# Patient Record
Sex: Male | Born: 1985 | Race: White | Hispanic: No | Marital: Single | Smoking: Never smoker
Health system: Southern US, Community
[De-identification: ages and names within clinical notes are randomized; demographics above are authoritative.]

## PROBLEM LIST (undated history)

## (undated) DIAGNOSIS — H5440 Blindness, one eye, unspecified eye: Secondary | ICD-10-CM

## (undated) DIAGNOSIS — H409 Unspecified glaucoma: Secondary | ICD-10-CM

## (undated) DIAGNOSIS — R519 Headache, unspecified: Secondary | ICD-10-CM

## (undated) DIAGNOSIS — F419 Anxiety disorder, unspecified: Secondary | ICD-10-CM

## (undated) DIAGNOSIS — F32A Depression, unspecified: Secondary | ICD-10-CM

## (undated) DIAGNOSIS — F810 Specific reading disorder: Secondary | ICD-10-CM

## (undated) DIAGNOSIS — F329 Major depressive disorder, single episode, unspecified: Secondary | ICD-10-CM

## (undated) DIAGNOSIS — B019 Varicella without complication: Secondary | ICD-10-CM

## (undated) DIAGNOSIS — F79 Unspecified intellectual disabilities: Secondary | ICD-10-CM

## (undated) DIAGNOSIS — R51 Headache: Secondary | ICD-10-CM

## (undated) HISTORY — PX: OTHER SURGICAL HISTORY: SHX169

## (undated) HISTORY — DX: Unspecified intellectual disabilities: F79

## (undated) HISTORY — DX: Blindness, one eye, unspecified eye: H54.40

## (undated) HISTORY — DX: Specific reading disorder: F81.0

## (undated) HISTORY — DX: Varicella without complication: B01.9

## (undated) HISTORY — DX: Unspecified glaucoma: H40.9

## (undated) HISTORY — DX: Headache: R51

## (undated) HISTORY — DX: Headache, unspecified: R51.9

---

## 2016-04-29 ENCOUNTER — Emergency Department (HOSPITAL_COMMUNITY)
Admission: EM | Admit: 2016-04-29 | Discharge: 2016-04-30 | Disposition: A | Discharge status: Home / Self Care | Payer: Medicare Other | Attending: Emergency Medicine | Admitting: Emergency Medicine

## 2016-04-29 ENCOUNTER — Encounter (HOSPITAL_COMMUNITY): Payer: Self-pay | Admitting: Emergency Medicine

## 2016-04-29 DIAGNOSIS — Z5181 Encounter for therapeutic drug level monitoring: Secondary | ICD-10-CM | POA: Insufficient documentation

## 2016-04-29 DIAGNOSIS — F4321 Adjustment disorder with depressed mood: Secondary | ICD-10-CM | POA: Diagnosis not present

## 2016-04-29 DIAGNOSIS — Z79899 Other long term (current) drug therapy: Secondary | ICD-10-CM | POA: Diagnosis not present

## 2016-04-29 DIAGNOSIS — F1099 Alcohol use, unspecified with unspecified alcohol-induced disorder: Secondary | ICD-10-CM | POA: Diagnosis not present

## 2016-04-29 DIAGNOSIS — R45851 Suicidal ideations: Secondary | ICD-10-CM | POA: Diagnosis present

## 2016-04-29 DIAGNOSIS — Z791 Long term (current) use of non-steroidal anti-inflammatories (NSAID): Secondary | ICD-10-CM | POA: Diagnosis not present

## 2016-04-29 HISTORY — DX: Depression, unspecified: F32.A

## 2016-04-29 HISTORY — DX: Major depressive disorder, single episode, unspecified: F32.9

## 2016-04-29 HISTORY — DX: Anxiety disorder, unspecified: F41.9

## 2016-04-29 NOTE — ED Triage Notes (Signed)
Brought in by [facility] from home with c/o SI.   Pt's mother called [employer] and reported that pt has been verbalizing "I am going to kill myself".  Pt denies SI/HI on triage assessment.  Has remote hx of depression.

## 2016-04-29 NOTE — ED Provider Notes (Signed)
Caleb Hoover Provider Note   CSN: [id number] Arrival date & time: 04/29/16  2350  By signing my name below, I, Caleb Hoover, attest that this documentation has been prepared under the direction and in the presence of Caleb Greek, MD . Electronically Signed: Royce Hoover, Scribe. 04/29/2016. 12:02 AM.  History   Chief Complaint Chief Complaint  Patient presents with  . Suicidal   The history is provided by the patient and medical records. No language interpreter was used.    HPI Comments:  Caleb Hoover is a 30 y.o. male who presents to the Emergency Department escorted by [facility] because his mother was concerned for suicidal ideation.  Pt denies suicidal ideation and depression.  Pt states his mother told him he was no longer going to take care of his bills and states that he told her "he was going to die" without her help.  Pt reports he did not intend for this to convey suicidal ideation.  [facility] adds pt has been in good spirits since he was picked up.    Past Medical History:  Diagnosis Date  . Anxiety   . Depression     Patient Active Problem List   Diagnosis Date Noted  . Adjustment disorder with depressed mood 04/30/2016    History reviewed. No pertinent surgical history.     Home Medications    Prior to Admission medications   Not on File    Family History History reviewed. No pertinent family history.  Social History Social History  Substance Use Topics  . Smoking status: Never Smoker  . Smokeless tobacco: Never Used  . Alcohol use Yes     Allergies   Ceclor [cefaclor]   Review of Systems Review of Systems  Constitutional: Negative for fever.  Psychiatric/Behavioral: Negative for suicidal ideas.  All other systems reviewed and are negative.    Physical Exam Updated Vital Signs BP 138/73 (BP Location: Right Arm)   Pulse (!) 58   Temp 97.9 F (36.6 C) (Oral)   Resp 18   Ht 7' (2.134 m)   Wt (!) 310 lb (140.6  kg)   SpO2 99%   BMI 30.89 kg/m   Physical Exam  Constitutional: He is oriented to person, place, and time. He appears well-developed and well-nourished. No distress.  HENT:  Head: Normocephalic and atraumatic.  Right Ear: Hearing normal.  Left Ear: Hearing normal.  Nose: Nose normal.  Mouth/Throat: Oropharynx is clear and moist and mucous membranes are normal.  Eyes: Conjunctivae and EOM are normal. Pupils are equal, round, and reactive to light.  Neck: Normal range of motion. Neck supple.  Cardiovascular: Regular rhythm, S1 normal and S2 normal.  Exam reveals no gallop and no friction rub.   No murmur heard. Pulmonary/Chest: Effort normal and breath sounds normal. No respiratory distress. He exhibits no tenderness.  Abdominal: Soft. Normal appearance and bowel sounds are normal. There is no hepatosplenomegaly. There is no tenderness. There is no rebound, no guarding, no tenderness at McBurney's point and negative Murphy's sign. No hernia.  Musculoskeletal: Normal range of motion.  Neurological: He is alert and oriented to person, place, and time. He has normal strength. No cranial nerve deficit or sensory deficit. Coordination normal. GCS eye subscore is 4. GCS verbal subscore is 5. GCS motor subscore is 6.  Skin: Skin is warm, dry and intact. No rash noted. No cyanosis.  Psychiatric: He has a normal mood and affect. His speech is normal and behavior is normal. Thought content  normal.  Nursing note and vitals reviewed.    ED Treatments / Results   DIAGNOSTIC STUDIES:  Oxygen Saturation is 98% on RA, NML by my interpretation.    COORDINATION OF CARE:  12:02 AM Discussed treatment plan with pt at bedside and pt agreed to plan.  Labs (all labs ordered are listed, but only abnormal results are displayed) Labs Reviewed  COMPREHENSIVE METABOLIC PANEL  ETHANOL  CBC WITH DIFFERENTIAL/PLATELET  URINE RAPID DRUG SCREEN, HOSP PERFORMED    EKG  EKG  Interpretation  Date/Time:  Sunday April 30 2016 02:02:12 EDT Ventricular Rate:  66 PR Interval:    QRS Duration: 103 QT Interval:  405 QTC Calculation: 425 R Axis:   55 Text Interpretation:  Sinus rhythm Normal ECG Confirmed by Betsey Holiday  MD, Harrell Gave [id number]) on 04/30/2016 2:21:56 AM Also confirmed by Betsey Holiday  MD, CHRISTOPHER [phone]), editor [place] CT, Leda Gauze [phone])  on 04/30/2016 7:54:32 AM       Radiology No results found.  Procedures Procedures (including critical care time)  Medications Ordered in ED Medications - No data to display   Initial Impression / Assessment and Plan / ED Course  I have reviewed the triage vital signs and the nursing notes.  Pertinent labs & imaging results that were available during my care of the patient were reviewed by me and considered in my medical decision making (see chart for details).  Clinical Course    Patient reports that he has been under a lot of stress and has had disagreements with his mother. Mother reportedly called [facility] because the patient told her that he was going to kill himself. At arrival to the emergency department patient denies these clams. He admits to feeling bad at times but does not have an active suicidal plan or ideation. Patient medically cleared and will undergo psychiatric evaluation.  Final Clinical Impressions(s) / ED Diagnoses   Final diagnoses:  Adjustment disorder with depressed mood    New Prescriptions There are no discharge medications for this patient.  I personally performed the services described in this documentation, which was scribed in my presence. The recorded information has been reviewed and is accurate.    Caleb Greek, MD 05/04/16 [phone]

## 2016-04-30 DIAGNOSIS — Z791 Long term (current) use of non-steroidal anti-inflammatories (NSAID): Secondary | ICD-10-CM | POA: Diagnosis not present

## 2016-04-30 DIAGNOSIS — F4321 Adjustment disorder with depressed mood: Secondary | ICD-10-CM | POA: Diagnosis not present

## 2016-04-30 DIAGNOSIS — Z79899 Other long term (current) drug therapy: Secondary | ICD-10-CM

## 2016-04-30 DIAGNOSIS — F1099 Alcohol use, unspecified with unspecified alcohol-induced disorder: Secondary | ICD-10-CM

## 2016-04-30 LAB — CBC WITH DIFFERENTIAL/PLATELET
BASOS ABS: 0 10*3/uL (ref 0.0–0.1)
BASOS PCT: 0 %
EOS ABS: 0 10*3/uL (ref 0.0–0.7)
EOS PCT: 0 %
HCT: 44.6 % (ref 39.0–52.0)
Hemoglobin: 15.1 g/dL (ref 13.0–17.0)
LYMPHS PCT: 25 %
Lymphs Abs: 2.4 10*3/uL (ref 0.7–4.0)
MCH: 29.5 pg (ref 26.0–34.0)
MCHC: 33.9 g/dL (ref 30.0–36.0)
MCV: 87.3 fL (ref 78.0–100.0)
MONO ABS: 0.7 10*3/uL (ref 0.1–1.0)
Monocytes Relative: 7 %
Neutro Abs: 6.4 10*3/uL (ref 1.7–7.7)
Neutrophils Relative %: 68 %
PLATELETS: 276 10*3/uL (ref 150–400)
RBC: 5.11 MIL/uL (ref 4.22–5.81)
RDW: 13.7 % (ref 11.5–15.5)
WBC: 9.5 10*3/uL (ref 4.0–10.5)

## 2016-04-30 LAB — COMPREHENSIVE METABOLIC PANEL
ALT: 34 U/L (ref 17–63)
AST: 34 U/L (ref 15–41)
Albumin: 4.2 g/dL (ref 3.5–5.0)
Alkaline Phosphatase: 84 U/L (ref 38–126)
Anion gap: 6 (ref 5–15)
BILIRUBIN TOTAL: 0.5 mg/dL (ref 0.3–1.2)
BUN: 17 mg/dL (ref 6–20)
CALCIUM: 8.9 mg/dL (ref 8.9–10.3)
CO2: 26 mmol/L (ref 22–32)
CREATININE: 0.97 mg/dL (ref 0.61–1.24)
Chloride: 107 mmol/L (ref 101–111)
GFR calc Af Amer: >60 mL/min (ref >60)
Glucose, Bld: 93 mg/dL (ref 65–99)
Potassium: 3.7 mmol/L (ref 3.5–5.1)
Sodium: 139 mmol/L (ref 135–145)
TOTAL PROTEIN: 7.7 g/dL (ref 6.5–8.1)

## 2016-04-30 LAB — RAPID URINE DRUG SCREEN, HOSP PERFORMED
Amphetamines: NOT DETECTED
BENZODIAZEPINES: NOT DETECTED
Barbiturates: NOT DETECTED
Cocaine: NOT DETECTED
OPIATES: NOT DETECTED
Tetrahydrocannabinol: NOT DETECTED

## 2016-04-30 LAB — ETHANOL

## 2016-04-30 MED ORDER — LORAZEPAM 1 MG PO TABS: 0.0000 mg | ORAL_TABLET | Freq: Two times a day (BID) | ORAL | Status: DC

## 2016-04-30 MED ORDER — THIAMINE HCL 100 MG/ML IJ SOLN: 100.0000 mg | Freq: Every day | INTRAMUSCULAR | Status: DC

## 2016-04-30 MED ORDER — ONDANSETRON HCL 4 MG PO TABS: 4.0000 mg | ORAL_TABLET | Freq: Three times a day (TID) | ORAL | Status: DC | PRN

## 2016-04-30 MED ORDER — IBUPROFEN 200 MG PO TABS
600.0000 mg | ORAL_TABLET | Freq: Three times a day (TID) | ORAL | Status: DC | PRN
  Administered 2016-04-30: 600 mg via ORAL
  Filled 2016-04-30: qty 3

## 2016-04-30 MED ORDER — ALUM & MAG HYDROXIDE-SIMETH 200-200-20 MG/5ML PO SUSP: 30.0000 mL | ORAL | Status: DC | PRN

## 2016-04-30 MED ORDER — ACETAMINOPHEN 325 MG PO TABS: 650.0000 mg | ORAL_TABLET | ORAL | Status: DC | PRN

## 2016-04-30 MED ORDER — VITAMIN B-1 100 MG PO TABS
100.0000 mg | ORAL_TABLET | Freq: Every day | ORAL | Status: DC
  Administered 2016-04-30: 100 mg via ORAL
  Filled 2016-04-30: qty 1

## 2016-04-30 MED ORDER — ZOLPIDEM TARTRATE 5 MG PO TABS: 5.0000 mg | ORAL_TABLET | Freq: Every evening | ORAL | Status: DC | PRN

## 2016-04-30 MED ORDER — LORAZEPAM 1 MG PO TABS: 0.0000 mg | ORAL_TABLET | Freq: Four times a day (QID) | ORAL | Status: DC

## 2016-04-30 NOTE — ED Notes (Signed)
No respiratory or acute distress noted alert and oriented x 3 hospital sitter at bedside.

## 2016-04-30 NOTE — [facility] Suicide Risk Assessment (Signed)
Suicide Risk Assessment  Discharge Assessment   [facility] Discharge Suicide Risk Assessment   Principal Problem: Adjustment disorder with depressed mood Discharge Diagnoses:  Patient Active Problem List   Diagnosis Date Noted  . Adjustment disorder with depressed mood [F43.21] 04/30/2016    Priority: High    Total Time spent with patient: 45 minutes  Musculoskeletal: Strength & Muscle Tone: within normal limits Gait & Station: normal Patient leans: N/A  Psychiatric Specialty Exam: Physical Exam  Constitutional: He is oriented to person, place, and time. He appears well-developed and well-nourished.  HENT:  Head: Normocephalic.  Neck: Normal range of motion.  Respiratory: Effort normal.  Musculoskeletal: Normal range of motion.  Neurological: He is alert and oriented to person, place, and time.  Skin: Skin is warm and dry.  Psychiatric: He has a normal mood and affect. His speech is normal and behavior is normal. Judgment and thought content normal. Cognition and memory are normal.    Review of Systems  Constitutional: Negative.   HENT: Negative.   Eyes: Negative.   Respiratory: Negative.   Cardiovascular: Negative.   Gastrointestinal: Negative.   Genitourinary: Negative.   Musculoskeletal: Negative.   Skin: Negative.   Neurological: Negative.   Endo/Heme/Allergies: Negative.   Psychiatric/Behavioral: Negative.     Blood pressure 141/76, pulse (!) 55, temperature 97.8 F (36.6 C), resp. rate 18, height 7' (2.134 m), weight (!) 140.6 kg (310 lb), SpO2 98 %.Body mass index is 30.89 kg/m.  General Appearance: Casual  Eye Contact:  Good  Speech:  Normal Rate  Volume:  Normal  Mood:  Euthymic  Affect:  Congruent  Thought Process:  Coherent and Descriptions of Associations: Intact  Orientation:  Full (Time, Place, and Person)  Thought Content:  WDL  Suicidal Thoughts:  No  Homicidal Thoughts:  No  Memory:  Immediate;   Good Recent;   Good Remote;   Good  Judgement:   Fair  Insight:  Fair  Psychomotor Activity:  Normal  Concentration:  Concentration: Good and Attention Span: Good  Recall:  Good  Fund of Knowledge:  Fair  Language:  Good  Akathisia:  No  Handed:  Right  AIMS (if indicated):     Assets:  Housing Leisure Time Physical Health Resilience Social Support  ADL's:  Intact  Cognition:  WNL  Sleep:      Mental Status Per Nursing Assessment::   On Admission:   altercation with his mother  Demographic Factors:  Male, Adolescent or young adult and Living alone  Loss Factors: NA  Historical Factors: NA  Risk Reduction Factors:   Sense of responsibility to family, Living with another person, especially a relative and Positive social support  Continued Clinical Symptoms:  None  Cognitive Features That Contribute To Risk:  None    Suicide Risk:  Minimal: No identifiable suicidal ideation.  Patients presenting with no risk factors but with morbid ruminations; may be classified as minimal risk based on the severity of the depressive symptoms    Plan Of Care/Follow-up recommendations:  Activity:  as tolerateed Diet:  heart healthy diet  LORD, JAMISON, NP 04/30/2016, 10:40 AM

## 2016-04-30 NOTE — ED Notes (Signed)
Per TTS consult:  Pt to be further evaluated by psychiatrist in the morning.

## 2016-04-30 NOTE — Consult Note (Signed)
[facility] Consult   Reason for Consult:  Verbal altercation with his mother Referring Physician:  EDP Patient Identification: Caleb Hoover MRN:  [id number] Principal Diagnosis: Adjustment disorder with depressed mood Diagnosis:   Patient Active Problem List   Diagnosis Date Noted  . Adjustment disorder with depressed mood [F43.21] 04/30/2016    Priority: High    Total Time spent with patient: 45 minutes  Subjective:   Caleb Hoover is a 30 y.o. male patient does not warrant admission.  HPI:  30 yo male who presented to the ED after an altercation with his mother.  Evidently, she is upset that he is going to an [place] football in November and he has no money or a job.  He has graduated from high school and went to some college but has worked minimally.  The mother wants him to get a job and pay for the house she is allowing him to live in.  When she told him, he would have to move out if he did not get a job and start paying he told her he would be homeless with nothing to eat and would eventually die.  He adamantly denies he threatened suicide and denies suicidal ideations today.  Caleb Hoover also denies homicidal ideations, hallucinations, or substance abuse.  Admits to one past episode of depression with suicidal ideations but denies feeling this way now.  He plays videos games and works out on a daily basis but has no motivation to work.  Denies depression and wants to have "fun".  Stable for discharge.  Past Psychiatric History: depression  Risk to Self: Suicidal Ideation: No Suicidal Intent: No Is patient at risk for suicide?: No Suicidal Plan?: No Access to Means: No What has been your use of drugs/alcohol within the last 12 months?: pt reports to drinking socially Triggers for Past Attempts: Family contact (pt reports issues with mom) Intentional Self Injurious Behavior: None Risk to Others: Homicidal Ideation: Yes-Currently Present Thoughts of  Harm to Others: Yes-Currently Present Comment - Thoughts of Harm to Others: pt stated "have you ever seen the movie Purge, if that was real I would kill my father" Current Homicidal Intent: No Current Homicidal Plan: No Access to Homicidal Means: No Identified Victim: Father History of harm to others?: No Assessment of Violence: None Noted Does patient have access to weapons?: No Criminal Charges Pending?: No Does patient have a court date: No Prior Inpatient Therapy: Prior Inpatient Therapy: No Prior Outpatient Therapy: Prior Outpatient Therapy: Yes Prior Therapy Dates: 2015 Prior Therapy Facilty/Provider(s): Pt did not know but states someone used to come to his home 1x/week  Reason for Treatment: unsure Does patient have an ACCT team?: No Does patient have Intensive In-House Services?  : No Does patient have [facility] services? : No Does patient have P4[facility] services?: No  Past Medical History:  Past Medical History:  Diagnosis Date  . Anxiety   . Depression    History reviewed. No pertinent surgical history. Family History: History reviewed. No pertinent family history. Family Psychiatric  History: none Social History:  History  Alcohol Use  . Yes     History  Drug Use No    Social History   Social History  . Marital status: Single    Spouse name: N/A  . Number of children: N/A  . Years of education: N/A   Social History Main Topics  . Smoking status: Never Smoker  . Smokeless tobacco: Never Used  . Alcohol use Yes  .  Drug use: No  . Sexual activity: Not Asked   Other Topics Concern  . None   Social History Narrative  . None   Additional Social History:    Allergies:   Allergies  Allergen Reactions  . Ceclor [Cefaclor] Other (See Comments)    Childhood allergy.     Labs:  Results for orders placed or performed during the hospital encounter of 04/29/16 (from the past 48 hour(s))  Comprehensive metabolic panel     Status: None   Collection Time:  04/30/16  2:11 AM  Result Value Ref Range   Sodium 139 135 - 145 mmol/L   Potassium 3.7 3.5 - 5.1 mmol/L   Chloride 107 101 - 111 mmol/L   CO2 26 22 - 32 mmol/L   Glucose, Bld 93 65 - 99 mg/dL   BUN 17 6 - 20 mg/dL   Creatinine, Ser 0.97 0.61 - 1.24 mg/dL   Calcium 8.9 8.9 - 10.3 mg/dL   Total Protein 7.7 6.5 - 8.1 g/dL   Albumin 4.2 3.5 - 5.0 g/dL   AST 34 15 - 41 U/L   ALT 34 17 - 63 U/L   Alkaline Phosphatase 84 38 - 126 U/L   Total Bilirubin 0.5 0.3 - 1.2 mg/dL   GFR calc non Af Amer >60 >60 mL/min   GFR calc Af Amer >60 >60 mL/min    Comment: (NOTE) The eGFR has been calculated using the CKD EPI equation. This calculation has not been validated in all clinical situations. eGFR's persistently <60 mL/min signify possible Chronic Kidney Disease.    Anion gap 6 5 - 15  CBC with Diff     Status: None   Collection Time: 04/30/16  2:11 AM  Result Value Ref Range   WBC 9.5 4.0 - 10.5 K/uL   RBC 5.11 4.22 - 5.81 MIL/uL   Hemoglobin 15.1 13.0 - 17.0 g/dL   HCT 44.6 39.0 - 52.0 %   MCV 87.3 78.0 - 100.0 fL   MCH 29.5 26.0 - 34.0 pg   MCHC 33.9 30.0 - 36.0 g/dL   RDW 13.7 11.5 - 15.5 %   Platelets 276 150 - 400 K/uL   Neutrophils Relative % 68 %   Neutro Abs 6.4 1.7 - 7.7 K/uL   Lymphocytes Relative 25 %   Lymphs Abs 2.4 0.7 - 4.0 K/uL   Monocytes Relative 7 %   Monocytes Absolute 0.7 0.1 - 1.0 K/uL   Eosinophils Relative 0 %   Eosinophils Absolute 0.0 0.0 - 0.7 K/uL   Basophils Relative 0 %   Basophils Absolute 0.0 0.0 - 0.1 K/uL  Ethanol     Status: None   Collection Time: 04/30/16  2:12 AM  Result Value Ref Range   Alcohol, Ethyl (B) <5 <5 mg/dL    Comment:        LOWEST DETECTABLE LIMIT FOR SERUM ALCOHOL IS 5 mg/dL FOR MEDICAL PURPOSES ONLY   Urine rapid drug screen (hosp performed)not at [facility]     Status: None   Collection Time: 04/30/16  5:22 AM  Result Value Ref Range   Opiates NONE DETECTED NONE DETECTED   Cocaine NONE DETECTED NONE DETECTED    Benzodiazepines NONE DETECTED NONE DETECTED   Amphetamines NONE DETECTED NONE DETECTED   Tetrahydrocannabinol NONE DETECTED NONE DETECTED   Barbiturates NONE DETECTED NONE DETECTED    Comment:        DRUG SCREEN FOR MEDICAL PURPOSES ONLY.  IF CONFIRMATION IS NEEDED FOR ANY PURPOSE, NOTIFY  LAB WITHIN 5 DAYS.        LOWEST DETECTABLE LIMITS FOR URINE DRUG SCREEN Drug Class       Cutoff (ng/mL) Amphetamine      1000 Barbiturate      200 Benzodiazepine   696 Tricyclics       295 Opiates          300 Cocaine          300 THC              50     Current Facility-Administered Medications  Medication Dose Route Frequency Provider Last Rate Last Dose  . acetaminophen (TYLENOL) tablet 650 mg  650 mg Oral Q4H PRN Orpah Greek, MD      . alum & mag hydroxide-simeth (MAALOX/MYLANTA) 200-200-20 MG/5ML suspension 30 mL  30 mL Oral PRN Orpah Greek, MD      . ibuprofen (ADVIL,MOTRIN) tablet 600 mg  600 mg Oral Q8H PRN Orpah Greek, MD   600 mg at 04/30/16 0918  . ondansetron (ZOFRAN) tablet 4 mg  4 mg Oral Q8H PRN Orpah Greek, MD      . thiamine (VITAMIN B-1) tablet 100 mg  100 mg Oral Daily Orpah Greek, MD   100 mg at 04/30/16 2841   Or  . thiamine (B-1) injection 100 mg  100 mg Intravenous Daily Orpah Greek, MD      . zolpidem (AMBIEN) tablet 5 mg  5 mg Oral QHS PRN Orpah Greek, MD       No current outpatient prescriptions on file.    Musculoskeletal: Strength & Muscle Tone: within normal limits Gait & Station: normal Patient leans: N/A  Psychiatric Specialty Exam: Physical Exam  Constitutional: He is oriented to person, place, and time. He appears well-developed and well-nourished.  HENT:  Head: Normocephalic.  Neck: Normal range of motion.  Respiratory: Effort normal.  Musculoskeletal: Normal range of motion.  Neurological: He is alert and oriented to person, place, and time.  Skin: Skin is warm and dry.   Psychiatric: He has a normal mood and affect. His speech is normal and behavior is normal. Judgment and thought content normal. Cognition and memory are normal.    Review of Systems  Constitutional: Negative.   HENT: Negative.   Eyes: Negative.   Respiratory: Negative.   Cardiovascular: Negative.   Gastrointestinal: Negative.   Genitourinary: Negative.   Musculoskeletal: Negative.   Skin: Negative.   Neurological: Negative.   Endo/Heme/Allergies: Negative.   Psychiatric/Behavioral: Negative.     Blood pressure 141/76, pulse (!) 55, temperature 97.8 F (36.6 C), resp. rate 18, height 7' (2.134 m), weight (!) 140.6 kg (310 lb), SpO2 98 %.Body mass index is 30.89 kg/m.  General Appearance: Casual  Eye Contact:  Good  Speech:  Normal Rate  Volume:  Normal  Mood:  Euthymic  Affect:  Congruent  Thought Process:  Coherent and Descriptions of Associations: Intact  Orientation:  Full (Time, Place, and Person)  Thought Content:  WDL  Suicidal Thoughts:  No  Homicidal Thoughts:  No  Memory:  Immediate;   Good Recent;   Good Remote;   Good  Judgement:  Fair  Insight:  Fair  Psychomotor Activity:  Normal  Concentration:  Concentration: Good and Attention Span: Good  Recall:  Good  Fund of Knowledge:  Fair  Language:  Good  Akathisia:  No  Handed:  Right  AIMS (if indicated):     Assets:  Housing  Leisure Time Physical Health Resilience Social Support  ADL's:  Intact  Cognition:  WNL  Sleep:        Treatment Plan Summary: Daily contact with patient to assess and evaluate symptoms and progress in treatment, Medication management and Plan adjustment disorder with depressed mood:  -Crisis stabilization -Medication management:  No need for medications needed -Individual counseling -outpatient resources  Disposition: No evidence of imminent risk to self or others at present.    Caleb Boga, NP 04/30/2016 10:29 AM   Patient seen face to face for this psych evaluation  along with physician extender, case discussed with treatment team and formulated treatment plan. Patient is stable for discharge back to home as he does not required medication treatment. Reviewed the information documented and agree with the treatment plan.

## 2016-04-30 NOTE — Progress Notes (Signed)
CSW spoke with patient at bedside about outpatient mental health resources. CSW asked patient if he had any questions, patient replied no. CSW provided patient with outpatient mental health resources.

## 2016-04-30 NOTE — ED Notes (Signed)
Report on pt given to TCU RN.
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
## 2016-04-30 NOTE — ED Notes (Signed)
No respiratory or acute distress noted alert and oriented x 3 hospital sitter with pt.

## 2017-01-02 ENCOUNTER — Encounter: Payer: Self-pay | Admitting: Adult Health

## 2017-01-02 ENCOUNTER — Ambulatory Visit (INDEPENDENT_AMBULATORY_CARE_PROVIDER_SITE_OTHER): Payer: Medicare Other | Admitting: Adult Health

## 2017-01-02 VITALS — BP 138/72 | Temp 98.6°F | Ht >= 80 in | Wt 303.9 lb

## 2017-01-02 DIAGNOSIS — F4321 Adjustment disorder with depressed mood: Secondary | ICD-10-CM | POA: Diagnosis not present

## 2017-01-02 DIAGNOSIS — Z7689 Persons encountering health services in other specified circumstances: Secondary | ICD-10-CM

## 2017-01-02 NOTE — Patient Instructions (Signed)
It was great meeting you today!  Please follow up with me for your physical. If you need anything in the meantime, please let me know.    
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
## 2017-03-06 ENCOUNTER — Encounter: Payer: Self-pay | Admitting: Adult Health

## 2017-03-06 ENCOUNTER — Ambulatory Visit (INDEPENDENT_AMBULATORY_CARE_PROVIDER_SITE_OTHER): Payer: Medicare Other | Admitting: Adult Health

## 2017-03-06 VITALS — BP 136/86 | Temp 98.2°F | Ht >= 80 in | Wt 305.0 lb

## 2017-03-06 DIAGNOSIS — R945 Abnormal results of liver function studies: Secondary | ICD-10-CM

## 2017-03-06 DIAGNOSIS — Z0000 Encounter for general adult medical examination without abnormal findings: Secondary | ICD-10-CM

## 2017-03-06 DIAGNOSIS — F4321 Adjustment disorder with depressed mood: Secondary | ICD-10-CM | POA: Diagnosis not present

## 2017-03-06 DIAGNOSIS — N189 Chronic kidney disease, unspecified: Secondary | ICD-10-CM | POA: Diagnosis not present

## 2017-03-06 DIAGNOSIS — E785 Hyperlipidemia, unspecified: Secondary | ICD-10-CM | POA: Diagnosis not present

## 2017-03-06 LAB — LIPID PANEL
CHOL/HDL RATIO: 5
Cholesterol: 210 mg/dL — ABNORMAL HIGH (ref 0–200)
HDL: 45.9 mg/dL (ref >39.00)
LDL Cholesterol: 145 mg/dL — ABNORMAL HIGH (ref 0–99)
NONHDL: 163.72
Triglycerides: 95 mg/dL (ref 0.0–149.0)
VLDL: 19 mg/dL (ref 0.0–40.0)

## 2017-03-06 LAB — HEPATIC FUNCTION PANEL
ALBUMIN: 4.4 g/dL (ref 3.5–5.2)
ALK PHOS: 91 U/L (ref 39–117)
ALT: 21 U/L (ref 0–53)
AST: 21 U/L (ref 0–37)
Bilirubin, Direct: 0.1 mg/dL (ref 0.0–0.3)
TOTAL PROTEIN: 7.3 g/dL (ref 6.0–8.3)
Total Bilirubin: 0.6 mg/dL (ref 0.2–1.2)

## 2017-03-06 LAB — CBC WITH DIFFERENTIAL/PLATELET
Basophils Absolute: 0 10*3/uL (ref 0.0–0.1)
Basophils Relative: 0.3 % (ref 0.0–3.0)
EOS ABS: 0 10*3/uL (ref 0.0–0.7)
Eosinophils Relative: 0.6 % (ref 0.0–5.0)
HEMATOCRIT: 46.9 % (ref 39.0–52.0)
Hemoglobin: 15.6 g/dL (ref 13.0–17.0)
LYMPHS PCT: 31.5 % (ref 12.0–46.0)
Lymphs Abs: 2.3 10*3/uL (ref 0.7–4.0)
MCHC: 33.2 g/dL (ref 30.0–36.0)
MCV: 88.5 fl (ref 78.0–100.0)
Monocytes Absolute: 0.5 10*3/uL (ref 0.1–1.0)
Monocytes Relative: 7.4 % (ref 3.0–12.0)
NEUTROS ABS: 4.4 10*3/uL (ref 1.4–7.7)
Neutrophils Relative %: 60.2 % (ref 43.0–77.0)
PLATELETS: 278 10*3/uL (ref 150.0–400.0)
RBC: 5.3 Mil/uL (ref 4.22–5.81)
RDW: 13.7 % (ref 11.5–15.5)
WBC: 7.3 10*3/uL (ref 4.0–10.5)

## 2017-03-06 LAB — HEMOGLOBIN A1C: Hgb A1c MFr Bld: 5.8 % (ref 4.6–6.5)

## 2017-03-06 LAB — BASIC METABOLIC PANEL
BUN: 12 mg/dL (ref 6–23)
CHLORIDE: 103 meq/L (ref 96–112)
CO2: 27 meq/L (ref 19–32)
CREATININE: 1.15 mg/dL (ref 0.40–1.50)
Calcium: 9 mg/dL (ref 8.4–10.5)
GFR: 79.28 mL/min (ref >60.00)
Glucose, Bld: 87 mg/dL (ref 70–99)
Potassium: 3.6 mEq/L (ref 3.5–5.1)
Sodium: 141 mEq/L (ref 135–145)

## 2017-03-06 LAB — TSH: TSH: 2.06 u[IU]/mL (ref 0.35–4.50)

## 2017-03-06 NOTE — Patient Instructions (Signed)
It was great seeing you today   I will follow up with you regarding your blood work   Please continue to exercise and eat healthy   Follow up in one year or sooner if needed  Health Maintenance, Male A healthy lifestyle and preventative care can promote health and wellness.  Maintain regular health, dental, and eye exams.  Eat a healthy diet. Foods like vegetables, fruits, whole grains, low-fat dairy products, and lean protein foods contain the nutrients you need and are low in calories. Decrease your intake of foods high in solid fats, added sugars, and salt. Get information about a proper diet from your health care provider, if necessary.  Regular physical exercise is one of the most important things you can do for your health. Most adults should get at least 150 minutes of moderate-intensity exercise (any activity that increases your heart rate and causes you to sweat) each week. In addition, most adults need muscle-strengthening exercises on 2 or more days a week.   Maintain a healthy weight. The body mass index (BMI) is a screening tool to identify possible weight problems. It provides an estimate of body fat based on height and weight. Your health care provider can find your BMI and can help you achieve or maintain a healthy weight. For males 20 years and older:  A BMI below 18.5 is considered underweight.  A BMI of 18.5 to 24.9 is normal.  A BMI of 25 to 29.9 is considered overweight.  A BMI of 30 and above is considered obese.  Maintain normal blood lipids and cholesterol by exercising and minimizing your intake of saturated fat. Eat a balanced diet with plenty of fruits and vegetables. Blood tests for lipids and cholesterol should begin at age 35 and be repeated every 5 years. If your lipid or cholesterol levels are high, you are over age 80, or you are at high risk for heart disease, you may need your cholesterol levels checked more frequently.Ongoing high lipid and cholesterol  levels should be treated with medicines if diet and exercise are not working.  If you smoke, find out from your health care provider how to quit. If you do not use tobacco, do not start.  Lung cancer screening is recommended for adults aged 59-80 years who are at high risk for developing lung cancer because of a history of smoking. A yearly low-dose CT scan of the lungs is recommended for people who have at least a 30-pack-year history of smoking and are current smokers or have quit within the past 15 years. A pack year of smoking is smoking an average of 1 pack of cigarettes a day for 1 year (for example, a 30-pack-year history of smoking could mean smoking 1 pack a day for 30 years or 2 packs a day for 15 years). Yearly screening should continue until the smoker has stopped smoking for at least 15 years. Yearly screening should be stopped for people who develop a health problem that would prevent them from having lung cancer treatment.  If you choose to drink alcohol, do not have more than 2 drinks per day. One drink is considered to be 12 oz (360 mL) of beer, 5 oz (150 mL) of wine, or 1.5 oz (45 mL) of liquor.  Avoid the use of street drugs. Do not share needles with anyone. Ask for help if you need support or instructions about stopping the use of drugs.  High blood pressure causes heart disease and increases the risk of stroke.  High blood pressure is more likely to develop in:  People who have blood pressure in the end of the normal range (100-139/85-89 mm Hg).  People who are overweight or obese.  People who are African American.  If you are 24-75 years of age, have your blood pressure checked every 3-5 years. If you are 31 years of age or older, have your blood pressure checked every year. You should have your blood pressure measured twice--once when you are at a hospital or clinic, and once when you are not at a hospital or clinic. Record the average of the two measurements. To check your  blood pressure when you are not at a hospital or clinic, you can use:  An automated blood pressure machine at a pharmacy.  A home blood pressure monitor.  If you are 80-23 years old, ask your health care provider if you should take aspirin to prevent heart disease.  Diabetes screening involves taking a blood sample to check your fasting blood sugar level. This should be done once every 3 years after age 68 if you are at a normal weight and without risk factors for diabetes. Testing should be considered at a younger age or be carried out more frequently if you are overweight and have at least 1 risk factor for diabetes.  Colorectal cancer can be detected and often prevented. Most routine colorectal cancer screening begins at the age of 1 and continues through age 72. However, your health care provider may recommend screening at an earlier age if you have risk factors for colon cancer. On a yearly basis, your health care provider may provide home test kits to check for hidden blood in the stool. A small camera at the end of a tube may be used to directly examine the colon (sigmoidoscopy or colonoscopy) to detect the earliest forms of colorectal cancer. Talk to your health care provider about this at age 20 when routine screening begins. A direct exam of the colon should be repeated every 5-10 years through age 71, unless early forms of precancerous polyps or small growths are found.  People who are at an increased risk for hepatitis B should be screened for this virus. You are considered at high risk for hepatitis B if:  You were born in a country where hepatitis B occurs often. Talk with your health care provider about which countries are considered high risk.  Your parents were born in a high-risk country and you have not received a shot to protect against hepatitis B (hepatitis B vaccine).  You have HIV or AIDS.  You use needles to inject street drugs.  You live with, or have sex with,  someone who has hepatitis B.  You are a man who has sex with other men (MSM).  You get hemodialysis treatment.  You take certain medicines for conditions like cancer, organ transplantation, and autoimmune conditions.  Hepatitis C blood testing is recommended for all people born from [birth year] through 1964 and any individual with known risk factors for hepatitis C.  Healthy men should no longer receive prostate-specific antigen (PSA) blood tests as part of routine cancer screening. Talk to your health care provider about prostate cancer screening.  Testicular cancer screening is not recommended for adolescents or adult males who have no symptoms. Screening includes self-exam, a health care provider exam, and other screening tests. Consult with your health care provider about any symptoms you have or any concerns you have about testicular cancer.  Practice safe sex. Use condoms  and avoid high-risk sexual practices to reduce the spread of sexually transmitted infections (STIs).  You should be screened for STIs, including gonorrhea and chlamydia if:  You are sexually active and are younger than 24 years.  You are older than 24 years, and your health care provider tells you that you are at risk for this type of infection.  Your sexual activity has changed since you were last screened, and you are at an increased risk for chlamydia or gonorrhea. Ask your health care provider if you are at risk.  If you are at risk of being infected with HIV, it is recommended that you take a prescription medicine daily to prevent HIV infection. This is called pre-exposure prophylaxis (PrEP). You are considered at risk if:  You are a man who has sex with other men (MSM).  You are a heterosexual man who is sexually active with multiple partners.  You take drugs by injection.  You are sexually active with a partner who has HIV.  Talk with your health care provider about whether you are at high risk of being  infected with HIV. If you choose to begin PrEP, you should first be tested for HIV. You should then be tested every 3 months for as long as you are taking PrEP.  Use sunscreen. Apply sunscreen liberally and repeatedly throughout the day. You should seek shade when your shadow is shorter than you. Protect yourself by wearing long sleeves, pants, a wide-brimmed hat, and sunglasses year round whenever you are outdoors.  Tell your health care provider of new moles or changes in moles, especially if there is a change in shape or color. Also, tell your health care provider if a mole is larger than the size of a pencil eraser.  A one-time screening for abdominal aortic aneurysm (AAA) and surgical repair of large AAAs by ultrasound is recommended for men aged 84-75 years who are current or former smokers.  Stay current with your vaccines (immunizations).   This information is not intended to replace advice given to you by your health care provider. Make sure you discuss any questions you have with your health care provider.   Document Released: 01/13/2008 Document Revised: 08/07/2014 Document Reviewed: 12/12/2010 Elsevier Interactive Patient Education [employer].

## 2017-03-06 NOTE — Progress Notes (Signed)
Subjective:    Patient ID: Caleb Hoover, male    DOB: [birth year], 31 y.o.   MRN: [id number]  HPI  Patient presents for yearly follow up exam. He is a pleasant 31 year old male who  has a past medical history of Anxiety; Blindness of left eye; Chicken pox; Depression; Frequent headaches; Glaucoma; and Mental retardation.  All immunizations and health maintenance protocols were reviewed with the patient and needed orders were placed.  Appropriate screening laboratory values were ordered for the patient including screening of hyperlipidemia, renal function and hepatic function.  Medication reconciliation,  past medical history, social history, problem list and allergies were reviewed in detail with the patient  Goals were established with regard to weight loss, exercise, and  diet in compliance with medications. He "tries" to eat healthy and exercises 3 times per week.   Wt Readings from Last 3 Encounters:  03/06/17 (!) 305 lb (138.3 kg)  01/02/17 (!) 303 lb 14.4 oz (137.8 kg)  04/29/16 (!) 310 lb (140.6 kg)    He participates in routine dental and vision care.    Review of Systems  Constitutional: Negative.   HENT: Negative.   Eyes: Negative.   Respiratory: Negative.   Cardiovascular: Negative.   Gastrointestinal: Negative.   Endocrine: Negative.   Genitourinary: Negative.   Musculoskeletal: Negative.   Skin: Negative.   Allergic/Immunologic: Negative.   Neurological: Negative.   Hematological: Negative.   Psychiatric/Behavioral: Negative.   All other systems reviewed and are negative.  Past Medical History:  Diagnosis Date  . Anxiety   . Blindness of left eye    hit with paintball at age 43  . Chicken pox   . Depression   . Frequent headaches   . Glaucoma   . Mental retardation    Has trouble reading     Social History   Social History  . Marital status: Single    Spouse name: N/A  . Number of children: N/A  . Years of education: N/A    Occupational History  . Not on file.   Social History Main Topics  . Smoking status: Never Smoker  . Smokeless tobacco: Never Used  . Alcohol use Yes  . Drug use: No  . Sexual activity: Not on file   Other Topics Concern  . Not on file   Social History Narrative  . No narrative on file    Past Surgical History:  Procedure Laterality Date  . left eye surgery       Family History  Problem Relation Age of Onset  . Family history unknown: Yes    Allergies  Allergen Reactions  . Ceclor [Cefaclor] Other (See Comments)    Childhood allergy.     No current outpatient prescriptions on file prior to visit.   No current facility-administered medications on file prior to visit.     BP 136/86 (BP Location: Left Arm)   Temp 98.2 F (36.8 C) (Oral)   Ht 6' 9.25" (2.064 m)   Wt (!) 305 lb (138.3 kg)   BMI 32.48 kg/m       Objective:   Physical Exam  Constitutional: He is oriented to person, place, and time. He appears well-developed and well-nourished. No distress.  HENT:  Head: Normocephalic and atraumatic.  Right Ear: External ear normal.  Left Ear: External ear normal.  Nose: Nose normal.  Mouth/Throat: Oropharynx is clear and moist. No oropharyngeal exudate.  Eyes: Conjunctivae are normal. Right eye exhibits no discharge. Left eye  exhibits no discharge. Left eye exhibits abnormal extraocular motion. Left pupil is not reactive. Pupils are unequal.  Neck: Normal range of motion. Neck supple. No JVD present. No tracheal deviation present. No thyromegaly present.  Cardiovascular: Normal rate, regular rhythm, normal heart sounds and intact distal pulses.  Exam reveals no gallop and no friction rub.   No murmur heard. Pulmonary/Chest: Effort normal and breath sounds normal. No stridor. No respiratory distress. He has no wheezes. He has no rales. He exhibits no tenderness.  Abdominal: Soft. Bowel sounds are normal. He exhibits no distension and no mass. There is no  tenderness. There is no rebound and no guarding.  Musculoskeletal: Normal range of motion.  Lymphadenopathy:    He has no cervical adenopathy.  Neurological: He is alert and oriented to person, place, and time. He displays normal reflexes. No cranial nerve deficit. He exhibits normal muscle tone. Coordination normal.  Skin: Skin is warm and dry. No rash noted. He is not diaphoretic. No erythema. No pallor.  Psychiatric: He has a normal mood and affect. His behavior is normal. Judgment and thought content normal.  Nursing note and vitals reviewed.     Assessment & Plan:  1. Routine general medical examination at a health care facility - Continue to work out and eat healthy.  - Follow up if one year or sooner if needed - Basic metabolic panel - CBC with Differential/Platelet - Hemoglobin A1c - Hepatic function panel - Lipid panel - TSH  2. Adjustment disorder with depressed mood - No need for medication at this time  Dorothyann Peng, NP

## 2018-06-21 ENCOUNTER — Ambulatory Visit (INDEPENDENT_AMBULATORY_CARE_PROVIDER_SITE_OTHER): Payer: Medicare Other | Admitting: Family Medicine

## 2018-06-21 ENCOUNTER — Encounter: Payer: Self-pay | Admitting: Family Medicine

## 2018-06-21 VITALS — BP 150/84 | HR 95 | Temp 97.9°F | Wt 319.5 lb

## 2018-06-21 DIAGNOSIS — D225 Melanocytic nevi of trunk: Secondary | ICD-10-CM

## 2018-06-21 DIAGNOSIS — L720 Epidermal cyst: Secondary | ICD-10-CM

## 2018-06-21 DIAGNOSIS — G4700 Insomnia, unspecified: Secondary | ICD-10-CM | POA: Diagnosis not present

## 2018-06-21 DIAGNOSIS — D229 Melanocytic nevi, unspecified: Secondary | ICD-10-CM

## 2018-06-21 NOTE — Progress Notes (Signed)
Caleb Hoover Resides DOB: [birth year] Encounter date: 06/21/2018  This is a 32 y.o. male who presents with Chief Complaint  Patient presents with  . Nevus    lower back right side, painful to touch,   . Insomnia    having a hard time falling asleep, using meletonin, dose unknown, states that once he falls asleep it is a deep sleep    History of present illness:  Spot on back - felt fine one day and then next day felt like something bit him. Mom worried about cancer. Noted the first time years ago - has had mole there forever. But this came on this week. Not sure what he did.    Can't fall asleep. Works out. No napping. Just difficult to fall asleep. Stays up until 11-12pm. Feels extremely tired. Has been an issue for years (since high school). Not using melatonin now. But has fallen asleep quickly when he used this in past.       Allergies  Allergen Reactions  . Ceclor [Cefaclor] Other (See Comments)    Childhood allergy.    No outpatient medications have been marked as taking for the 06/21/18 encounter (Office Visit) with Caren Macadam, MD.    Review of Systems  Constitutional: Negative for chills, fatigue and fever.  Respiratory: Negative for cough, chest tightness, shortness of breath and wheezing.   Cardiovascular: Negative for chest pain, palpitations and leg swelling.  Skin: Positive for color change (see hpi).    Objective:  BP (!) 150/84 (BP Location: Left Arm, Patient Position: Sitting, Cuff Size: Large)   Pulse 95   Temp 97.9 F (36.6 C) (Oral)   Wt (!) 319 lb 8 oz (144.9 kg)   SpO2 98%   BMI 34.03 kg/m   Weight: (!) 319 lb 8 oz (144.9 kg)   BP Readings from Last 3 Encounters:  06/21/18 (!) 150/84  03/06/17 136/86  01/02/17 138/72   Wt Readings from Last 3 Encounters:  06/21/18 (!) 319 lb 8 oz (144.9 kg)  03/06/17 (!) 305 lb (138.3 kg)  01/02/17 (!) 303 lb 14.4 oz (137.8 kg)    Physical Exam  Constitutional: He is oriented to person,  place, and time. He appears well-developed and well-nourished. No distress.  Cardiovascular: Normal rate, regular rhythm and normal heart sounds. Exam reveals no friction rub.  No murmur heard. No lower extremity edema  Pulmonary/Chest: Effort normal and breath sounds normal. No respiratory distress. He has no wheezes. He has no rales.  Neurological: He is alert and oriented to person, place, and time.  Skin:  0.8x1cm light brown macule with overlying fleshy light brown papule left posterior flank.  0.7cm brown papule, traumatized left posterior flank.   Psychiatric: His behavior is normal. Cognition and memory are normal.   Shave Biopsy Procedure Note  Pre-operative Diagnosis: aggravated mole  Post-operative Diagnosis: same  Locations: left posterior trunk  0.7 cm X 1 cm  Anesthesia: Lidocaine 1% with epinephrine   Procedure Details  Patient informed of the risks, including bleeding and infection, and benefits of the  procedure and Verbal informed consent obtained. The lesion and surrounding area was given a sterile prep using chloraprep and draped in the usual sterile fashion. The skin lesion was shaved superficially using a dermablade.  Hemostasis of biopsy site was achieved using aluminum chloride. Sterile dressing applied. The specimen was sent for pathologic examination. The patient tolerated the procedure well.  Complications: none.  Plan: 1. Instructed to keep the wound dry and covered  for 24 hrs and clean thereafter with soap and water.  But no chlorine hot tubs or pool exposure to surgical site.  2. Warning signs of infection were reviewed.   3. Recommended that the patient use OTC analgesics as needed for pain.  4. Will contact patient with pathology results when obtained.  Micheline Rough, MD   Assessment/Plan  1. Change in mole Suspect benign traumatized mole.  Due to change in bleeding, has been sent for pathology.  I will contact patient once results have  returned.  2. Insomnia: Her that melatonin was a safe sleep aid for inducing sleep.  Certainly let us know if this is not working.  Return if symptoms worsen or fail to improve.        Micheline Rough, MD

## 2019-01-27 ENCOUNTER — Encounter (HOSPITAL_COMMUNITY): Payer: Self-pay | Admitting: Emergency Medicine

## 2019-01-27 ENCOUNTER — Ambulatory Visit (HOSPITAL_COMMUNITY)
Admission: EM | Admit: 2019-01-27 | Discharge: 2019-01-27 | Disposition: A | Discharge status: Home / Self Care | Payer: Medicare Other | Attending: Family Medicine | Admitting: Family Medicine

## 2019-01-27 ENCOUNTER — Other Ambulatory Visit: Payer: Self-pay

## 2019-01-27 DIAGNOSIS — H6123 Impacted cerumen, bilateral: Secondary | ICD-10-CM | POA: Diagnosis not present

## 2019-01-27 DIAGNOSIS — H9201 Otalgia, right ear: Secondary | ICD-10-CM | POA: Diagnosis not present

## 2019-01-27 MED ORDER — CARBAMIDE PEROXIDE 6.5 % OT SOLN
OTIC | Status: AC
  Filled 2019-01-27: qty 15

## 2019-01-27 NOTE — ED Triage Notes (Signed)
Pt here for right ear fullness; pt scheduled to have sx on eye this week per mother

## 2019-01-27 NOTE — Discharge Instructions (Addendum)
Your ear wax was removed today, except for a small amount in the left ear which did not come out with irrigation today.  Follow up as needed with your primary care provider.

## 2019-01-27 NOTE — ED Provider Notes (Signed)
[place] [facility]    CSN: [id number] Arrival date & time: 01/27/19  1436     History   Chief Complaint Chief Complaint  Patient presents with  . Otalgia    HPI Caleb Hoover is a 33 y.o. male accompanied by his mother.   Patient presents today with a 2-day history of right ear fullness and excessive ear wax.  He denies trauma.  He has had similar symptoms in the past which were ear wax removed with irrigation.  He denies fever, chills, sinus congestion, nausea, vomiting, shortness of breath, pain.  The history is provided by the patient and a parent. No language interpreter was used.    Past Medical History:  Diagnosis Date  . Anxiety   . Blindness of left eye    hit with paintball at age 24  . Chicken pox   . Depression   . Frequent headaches   . Glaucoma   . Mental retardation    Has trouble reading     Patient Active Problem List   Diagnosis Date Noted  . Adjustment disorder with depressed mood 04/30/2016    Past Surgical History:  Procedure Laterality Date  . left eye surgery          Home Medications    Prior to Admission medications   Not on File    Family History Family History  Family history unknown: Yes    Social History Social History   Tobacco Use  . Smoking status: Never Smoker  . Smokeless tobacco: Never Used  Substance Use Topics  . Alcohol use: Yes  . Drug use: No     Allergies   Ceclor [cefaclor]   Review of Systems Review of Systems  Constitutional: Negative for chills and fever.  HENT: Positive for ear pain. Negative for sore throat.   Eyes: Negative for pain and visual disturbance.  Respiratory: Negative for cough and shortness of breath.   Cardiovascular: Negative for chest pain and palpitations.  Gastrointestinal: Negative for abdominal pain and vomiting.  Genitourinary: Negative for dysuria and hematuria.  Musculoskeletal: Negative for arthralgias and back pain.  Skin: Negative for color change  and rash.  Neurological: Negative for seizures and syncope.  All other systems reviewed and are negative.    Physical Exam Triage Vital Signs ED Triage Vitals  Enc Vitals Group     BP 01/27/19 1516 137/85     Pulse Rate 01/27/19 1516 90     Resp 01/27/19 1516 18     Temp 01/27/19 1516 98.2 F (36.8 C)     Temp Source 01/27/19 1516 Oral     SpO2 01/27/19 1516 98 %     Weight --      Height --      Head Circumference --      Peak Flow --      Pain Score 01/27/19 1517 2     Pain Loc --      Pain Edu? --      Excl. in [place]? --    No data found.  Updated Vital Signs BP 137/85 (BP Location: Right Arm)   Pulse 90   Temp 98.2 F (36.8 C) (Oral)   Resp 18   SpO2 98%   Visual Acuity Right Eye Distance:   Left Eye Distance:   Bilateral Distance:    Right Eye Near:   Left Eye Near:    Bilateral Near:     Physical Exam Vitals signs and nursing note reviewed.  Constitutional:      Appearance: He is well-developed.  HENT:     Head: Normocephalic and atraumatic.     Right Ear: There is impacted cerumen.     Left Ear: There is impacted cerumen.  Eyes:     Conjunctiva/sclera: Conjunctivae normal.  Neck:     Musculoskeletal: Neck supple.  Cardiovascular:     Rate and Rhythm: Normal rate and regular rhythm.  Pulmonary:     Effort: Pulmonary effort is normal. No respiratory distress.     Breath sounds: Normal breath sounds.  Abdominal:     Palpations: Abdomen is soft.     Tenderness: There is no abdominal tenderness.  Skin:    General: Skin is warm and dry.  Neurological:     Mental Status: He is alert.      UC Treatments / Results  Labs (all labs ordered are listed, but only abnormal results are displayed) Labs Reviewed - No data to display  EKG None  Radiology No results found.  Procedures Ear Cerumen Removal  Date/Time: 01/27/2019 3:28 PM Performed by: Sharion Balloon, NP Authorized by: Raylene Everts, MD   Consent:    Consent obtained:  Verbal    Consent given by:  Parent and patient Procedure details:    Location:  L ear and R ear   Procedure type: irrigation   Post-procedure details:    Inspection:  TM intact   Hearing quality:  Normal   Patient tolerance of procedure:  Tolerated well, no immediate complications   (including critical care time)  Medications Ordered in UC Medications  carbamide peroxide (DEBROX) 6.5 % OTIC (EAR) solution (has no administration in time range)    Initial Impression / Assessment and Plan / UC Course  I have reviewed the triage vital signs and the nursing notes.  Pertinent labs & imaging results that were available during my care of the patient were reviewed by me and considered in my medical decision making (see chart for details).   Cerumen impaction, resolved with irrigation.  Follow up with primary care provider as needed.    Final Clinical Impressions(s) / UC Diagnoses   Final diagnoses:  Bilateral impacted cerumen     Discharge Instructions     Your ear wax was removed today, except for a small amount in the left ear which did not come out with irrigation today.  Follow up as needed with your primary care provider.      ED Prescriptions    None     Controlled Substance Prescriptions [place] Controlled Substance Registry consulted? Not Applicable   Sharion Balloon, NP 01/27/19 1624

## 2019-04-03 ENCOUNTER — Telehealth (INDEPENDENT_AMBULATORY_CARE_PROVIDER_SITE_OTHER): Payer: Medicare Other | Admitting: Adult Health

## 2019-04-03 ENCOUNTER — Other Ambulatory Visit: Payer: Self-pay

## 2019-04-03 DIAGNOSIS — J22 Unspecified acute lower respiratory infection: Secondary | ICD-10-CM | POA: Diagnosis not present

## 2019-04-03 MED ORDER — PREDNISONE 10 MG PO TABS: ORAL_TABLET | ORAL | 0 refills | Status: DC

## 2019-04-03 MED ORDER — BENZONATATE 200 MG PO CAPS: 200.0000 mg | ORAL_CAPSULE | Freq: Two times a day (BID) | ORAL | 0 refills | Status: DC | PRN

## 2019-04-03 NOTE — Progress Notes (Signed)
Virtual Visit via Video Note  I connected with Caleb Hoover on 04/03/19 at 10:00 AM EDT by a video enabled telemedicine application and verified that I am speaking with the correct person using two identifiers.  Location patient: home Location provider:work or home office Persons participating in the virtual visit: patient, provider  I discussed the limitations of evaluation and management by telemedicine and the availability of in person appointments. The patient expressed understanding and agreed to proceed.   HPI: 33 year old male who is being evaluated today for an acute issue.  He reports that he has had a dry constant cough for the last 2 to 3 weeks.  Associated symptoms include chest tightness with deep breath, not feeling as though he is getting a full breath, and some mild wheezing( especially with exhaling) and shortness of breath.  He denies any fevers, chills, or feeling acutely ill.  His cough is not worse at night.   He has had bronchitis in the past and states "it feels exactly like it did the last time".  Has not been using anything over-the-counter   ROS: See pertinent positives and negatives per HPI.  Past Medical History:  Diagnosis Date  . Anxiety   . Blindness of left eye    hit with paintball at age 29  . Chicken pox   . Depression   . Frequent headaches   . Glaucoma   . Mental retardation    Has trouble reading     Past Surgical History:  Procedure Laterality Date  . left eye surgery       Family History  Family history unknown: Yes    No current outpatient medications on file.  EXAM:  VITALS per patient if applicable:  GENERAL: alert, oriented, appears well and in no acute distress  HEENT: atraumatic, conjunttiva clear, no obvious abnormalities on inspection of external nose and ears  NECK: normal movements of the head and neck  LUNGS: on inspection no signs of respiratory distress, breathing rate appears normal, no obvious gross SOB,  gasping or wheezing  CV: no obvious cyanosis  MS: moves all visible extremities without noticeable abnormality  PSYCH/NEURO: pleasant and cooperative, no obvious depression or anxiety, speech and thought processing grossly intact  ASSESSMENT AND PLAN:  Discussed the following assessment and plan:  1. Lower respiratory infection -Does not appear to be pneumonia possible bronchitis.  Will treat as it is bronchitis with prednisone and Tessalon Perles.  He was advised to follow-up if no improvement in the next 2 to 3 days or sooner if fever develops - predniSONE (DELTASONE) 10 MG tablet; 40 mg x 3 days, 20 mg x 3 days, 10 mg x 3 days  Dispense: 21 tablet; Refill: 0 - benzonatate (TESSALON) 200 MG capsule; Take 1 capsule (200 mg total) by mouth 2 (two) times daily as needed for cough.  Dispense: 20 capsule; Refill: 0     I discussed the assessment and treatment plan with the patient. The patient was provided an opportunity to ask questions and all were answered. The patient agreed with the plan and demonstrated an understanding of the instructions.   The patient was advised to call back or seek an in-person evaluation if the symptoms worsen or if the condition fails to improve as anticipated.   Dorothyann Peng, NP

## 2019-09-08 ENCOUNTER — Ambulatory Visit: Payer: Medicare Other | Attending: Internal Medicine

## 2019-09-08 ENCOUNTER — Other Ambulatory Visit: Payer: Self-pay

## 2019-09-08 DIAGNOSIS — Z20822 Contact with and (suspected) exposure to covid-19: Secondary | ICD-10-CM

## 2019-09-09 ENCOUNTER — Telehealth: Payer: Self-pay | Admitting: General Practice

## 2019-09-09 LAB — NOVEL CORONAVIRUS, NAA: SARS-CoV-2, NAA: NOT DETECTED

## 2019-09-09 NOTE — Telephone Encounter (Signed)
Gave patient mother negative covid test results.

## 2019-10-04 DIAGNOSIS — I454 Nonspecific intraventricular block: Secondary | ICD-10-CM | POA: Diagnosis not present

## 2019-10-04 DIAGNOSIS — H5462 Unqualified visual loss, left eye, normal vision right eye: Secondary | ICD-10-CM | POA: Diagnosis not present

## 2019-10-04 DIAGNOSIS — I493 Ventricular premature depolarization: Secondary | ICD-10-CM | POA: Diagnosis not present

## 2019-10-04 DIAGNOSIS — Z20822 Contact with and (suspected) exposure to covid-19: Secondary | ICD-10-CM | POA: Diagnosis not present

## 2019-10-04 DIAGNOSIS — D72829 Elevated white blood cell count, unspecified: Secondary | ICD-10-CM | POA: Diagnosis not present

## 2019-10-04 DIAGNOSIS — Z03818 Encounter for observation for suspected exposure to other biological agents ruled out: Secondary | ICD-10-CM | POA: Diagnosis not present

## 2019-10-05 DIAGNOSIS — H5440 Blindness, one eye, unspecified eye: Secondary | ICD-10-CM | POA: Diagnosis not present

## 2019-10-06 DIAGNOSIS — Z79899 Other long term (current) drug therapy: Secondary | ICD-10-CM | POA: Diagnosis not present

## 2019-10-06 DIAGNOSIS — I493 Ventricular premature depolarization: Secondary | ICD-10-CM | POA: Diagnosis not present

## 2019-10-06 DIAGNOSIS — I4589 Other specified conduction disorders: Secondary | ICD-10-CM | POA: Diagnosis not present

## 2019-10-06 DIAGNOSIS — Z5181 Encounter for therapeutic drug level monitoring: Secondary | ICD-10-CM | POA: Diagnosis not present

## 2019-11-10 DIAGNOSIS — H5440 Blindness, one eye, unspecified eye: Secondary | ICD-10-CM | POA: Diagnosis not present

## 2019-11-10 DIAGNOSIS — H5710 Ocular pain, unspecified eye: Secondary | ICD-10-CM | POA: Diagnosis not present

## 2020-02-04 DIAGNOSIS — Z79899 Other long term (current) drug therapy: Secondary | ICD-10-CM | POA: Diagnosis not present

## 2020-02-04 DIAGNOSIS — H3589 Other specified retinal disorders: Secondary | ICD-10-CM | POA: Diagnosis not present

## 2020-02-04 DIAGNOSIS — H5789 Other specified disorders of eye and adnexa: Secondary | ICD-10-CM | POA: Diagnosis not present

## 2020-02-04 DIAGNOSIS — H2012 Chronic iridocyclitis, left eye: Secondary | ICD-10-CM | POA: Diagnosis not present

## 2020-02-04 DIAGNOSIS — H10402 Unspecified chronic conjunctivitis, left eye: Secondary | ICD-10-CM | POA: Diagnosis not present

## 2020-02-04 DIAGNOSIS — H5710 Ocular pain, unspecified eye: Secondary | ICD-10-CM | POA: Diagnosis not present

## 2020-02-04 DIAGNOSIS — H5440 Blindness, one eye, unspecified eye: Secondary | ICD-10-CM | POA: Diagnosis not present

## 2020-02-04 DIAGNOSIS — H5442A5 Blindness left eye category 5, normal vision right eye: Secondary | ICD-10-CM | POA: Diagnosis not present

## 2020-02-05 ENCOUNTER — Encounter (HOSPITAL_BASED_OUTPATIENT_CLINIC_OR_DEPARTMENT_OTHER): Payer: Self-pay | Admitting: *Deleted

## 2020-02-05 ENCOUNTER — Emergency Department (HOSPITAL_BASED_OUTPATIENT_CLINIC_OR_DEPARTMENT_OTHER): Payer: Medicare Other

## 2020-02-05 ENCOUNTER — Other Ambulatory Visit: Payer: Self-pay

## 2020-02-05 ENCOUNTER — Emergency Department (HOSPITAL_BASED_OUTPATIENT_CLINIC_OR_DEPARTMENT_OTHER)
Admission: EM | Admit: 2020-02-05 | Discharge: 2020-02-05 | Disposition: A | Discharge status: Home / Self Care | Payer: Medicare Other | Attending: Emergency Medicine | Admitting: Emergency Medicine

## 2020-02-05 DIAGNOSIS — R112 Nausea with vomiting, unspecified: Secondary | ICD-10-CM | POA: Insufficient documentation

## 2020-02-05 DIAGNOSIS — R519 Headache, unspecified: Secondary | ICD-10-CM | POA: Insufficient documentation

## 2020-02-05 DIAGNOSIS — R079 Chest pain, unspecified: Secondary | ICD-10-CM | POA: Insufficient documentation

## 2020-02-05 DIAGNOSIS — Y848 Other medical procedures as the cause of abnormal reaction of the patient, or of later complication, without mention of misadventure at the time of the procedure: Secondary | ICD-10-CM | POA: Insufficient documentation

## 2020-02-05 DIAGNOSIS — R0602 Shortness of breath: Secondary | ICD-10-CM | POA: Diagnosis not present

## 2020-02-05 DIAGNOSIS — Z20822 Contact with and (suspected) exposure to covid-19: Secondary | ICD-10-CM | POA: Diagnosis not present

## 2020-02-05 DIAGNOSIS — R05 Cough: Secondary | ICD-10-CM | POA: Diagnosis not present

## 2020-02-05 DIAGNOSIS — T8189XA Other complications of procedures, not elsewhere classified, initial encounter: Secondary | ICD-10-CM | POA: Insufficient documentation

## 2020-02-05 DIAGNOSIS — R10816 Epigastric abdominal tenderness: Secondary | ICD-10-CM | POA: Diagnosis not present

## 2020-02-05 DIAGNOSIS — Z79899 Other long term (current) drug therapy: Secondary | ICD-10-CM | POA: Insufficient documentation

## 2020-02-05 DIAGNOSIS — R1110 Vomiting, unspecified: Secondary | ICD-10-CM | POA: Diagnosis not present

## 2020-02-05 LAB — CBC WITH DIFFERENTIAL/PLATELET
Abs Immature Granulocytes: 0.09 10*3/uL — ABNORMAL HIGH (ref 0.00–0.07)
Basophils Absolute: 0 10*3/uL (ref 0.0–0.1)
Basophils Relative: 0 %
Eosinophils Absolute: 0 10*3/uL (ref 0.0–0.5)
Eosinophils Relative: 0 %
HCT: 44.5 % (ref 39.0–52.0)
Hemoglobin: 14.7 g/dL (ref 13.0–17.0)
Immature Granulocytes: 1 %
Lymphocytes Relative: 8 %
Lymphs Abs: 1.3 10*3/uL (ref 0.7–4.0)
MCH: 28.8 pg (ref 26.0–34.0)
MCHC: 33 g/dL (ref 30.0–36.0)
MCV: 87.1 fL (ref 80.0–100.0)
Monocytes Absolute: 0.8 10*3/uL (ref 0.1–1.0)
Monocytes Relative: 5 %
Neutro Abs: 14.6 10*3/uL — ABNORMAL HIGH (ref 1.7–7.7)
Neutrophils Relative %: 86 %
Platelets: 321 10*3/uL (ref 150–400)
RBC: 5.11 MIL/uL (ref 4.22–5.81)
RDW: 13.4 % (ref 11.5–15.5)
WBC: 16.8 10*3/uL — ABNORMAL HIGH (ref 4.0–10.5)
nRBC: 0 % (ref 0.0–0.2)

## 2020-02-05 LAB — BASIC METABOLIC PANEL
Anion gap: 12 (ref 5–15)
BUN: 11 mg/dL (ref 6–20)
CO2: 24 mmol/L (ref 22–32)
Calcium: 8.7 mg/dL — ABNORMAL LOW (ref 8.9–10.3)
Chloride: 103 mmol/L (ref 98–111)
Creatinine, Ser: 0.83 mg/dL (ref 0.61–1.24)
GFR calc Af Amer: >60 mL/min (ref >60)
GFR calc non Af Amer: >60 mL/min (ref >60)
Glucose, Bld: 121 mg/dL — ABNORMAL HIGH (ref 70–99)
Potassium: 4.1 mmol/L (ref 3.5–5.1)
Sodium: 139 mmol/L (ref 135–145)

## 2020-02-05 LAB — TROPONIN I (HIGH SENSITIVITY): Troponin I (High Sensitivity): 4 ng/L (ref <18)

## 2020-02-05 LAB — D-DIMER, QUANTITATIVE: D-Dimer, Quant: <0.27 ug/mL-FEU (ref 0.00–0.50)

## 2020-02-05 MED ORDER — ACETAMINOPHEN 325 MG PO TABS
650.0000 mg | ORAL_TABLET | Freq: Once | ORAL | Status: AC
  Administered 2020-02-05: 650 mg via ORAL
  Filled 2020-02-05: qty 2

## 2020-02-05 NOTE — ED Notes (Signed)
ED Provider at bedside. 

## 2020-02-05 NOTE — ED Triage Notes (Signed)
He had eye surgery at [facility] yesterday with vomiting after surgery. Today he has had SOB since this am.

## 2020-02-05 NOTE — ED Notes (Signed)
Patient transported to X-ray 

## 2020-02-05 NOTE — ED Provider Notes (Signed)
[facility] EMERGENCY DEPARTMENT Provider Note   CSN: [id number] Arrival date & time: 02/05/20  1309     History Chief Complaint  Patient presents with  . Shortness of Breath    Caleb Hoover is a 34 y.o. 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 Patient is not on anticoagulation.  HPI Patient presents to emergency department today with chief complaint of sudden onset of shortness of breath. 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 Vickki Muff at [facility] yesterday. The procedure went well and he was discharged home with prescription for PO prednisone. He did receive dose of IV steroids in PACU.  This morning he woke up at 5am to first dose of prednisone taper. He was feeling like his usual self at that time. Approximately 3 hours later patient had shortness of breath and chest pain. Patient describes the chest pain as a soreness in the middle of his chest. Chest pain does not radiate. He rates pain 4/10 in severity. Pain is intermittent.  His mother called surgeon's office and was recommended to come to emergency department for further evaluation. He denies fever, chills, cough, hemoptysis, pleuritic chest pain, shortness of breath, dyspnea on exertion, nausea, emesis, lower extremity edema. Denies family history of cardiac disease.    Past Medical History:  Diagnosis Date  . Anxiety   . Blindness of left eye    hit with paintball at age 16  . Chicken pox   . Depression   . Frequent headaches   . Glaucoma   . Mental retardation    Has trouble reading     Patient Active Problem List   Diagnosis Date Noted  . Adjustment disorder with depressed mood 04/30/2016    Past Surgical History:  Procedure Laterality Date  . left eye surgery          Family History  Family history  unknown: Yes    Social History   Tobacco Use  . Smoking status: Never Smoker  . Smokeless tobacco: Never Used  Substance Use Topics  . Alcohol use: Yes  . Drug use: No    Home Medications Prior to Admission medications   Medication Sig Start Date End Date Taking? Authorizing Provider  erythromycin ophthalmic ointment Use a small amount on stitches 4 times a day for 14 days 02/04/20  Yes [provider]  ondansetron (ZOFRAN-ODT) 4 MG disintegrating tablet Take by mouth. 02/04/20 02/11/20 Yes [provider]  predniSONE (DELTASONE) 10 MG tablet Take by mouth. 02/04/20  Yes [provider]  benzonatate (TESSALON) 200 MG capsule Take 1 capsule (200 mg total) by mouth 2 (two) times daily as needed for cough. 04/03/19   Nafziger, Tommi Rumps, NP  HYDROmorphone (DILAUDID) 4 MG tablet Take by mouth. 02/04/20 02/09/20  [provider]  predniSONE (DELTASONE) 10 MG tablet 40 mg x 3 days, 20 mg x 3 days, 10 mg x 3 days 04/03/19   Dorothyann Peng, NP    Allergies    Ceclor [cefaclor]  Review of Systems   Review of Systems All other systems are reviewed and are negative for acute change except as noted in the HPI.  Physical Exam Updated Vital Signs BP 136/72   Pulse (!) 58   Temp 98.4 F (36.9 C) (Oral)   Resp 12   Wt 136.1 kg   SpO2 95%  BMI 31.95 kg/m   Physical Exam Vitals and nursing note reviewed.  Constitutional:      General: He is not in acute distress.    Appearance: He is not ill-appearing.  HENT:     Head: Normocephalic.     Comments: Dressing taped over left eye. Dressing is clean, dry intact.     Right Ear: Tympanic membrane and external ear normal.     Left Ear: Tympanic membrane and external ear normal.     Nose: Nose normal.     Mouth/Throat:     Mouth: Mucous membranes are moist.     Pharynx: Oropharynx is clear.  Eyes:     General: No scleral icterus.       Right eye: No discharge.        Left eye: No discharge.     Extraocular  Movements: Extraocular movements intact.     Conjunctiva/sclera: Conjunctivae normal.     Pupils: Pupils are equal, round, and reactive to light.  Neck:     Vascular: No JVD.  Cardiovascular:     Rate and Rhythm: Normal rate and regular rhythm.     Pulses: Normal pulses.          Radial pulses are 2+ on the right side and 2+ on the left side.     Heart sounds: Normal heart sounds.  Pulmonary:     Comments: Lungs clear to auscultation in all fields. Symmetric chest rise. No wheezing, rales, or rhonchi. Chest:     Chest wall: No tenderness.  Abdominal:     Comments: Abdomen is soft, non-distended, and non-tender in all quadrants. No rigidity, no guarding. No peritoneal signs.  Musculoskeletal:        General: Normal range of motion.     Cervical back: Normal range of motion.     Right lower leg: No edema.     Left lower leg: No edema.  Skin:    General: Skin is warm and dry.     Capillary Refill: Capillary refill takes less than 2 seconds.  Neurological:     Mental Status: He is oriented to person, place, and time.     GCS: GCS eye subscore is 4. GCS verbal subscore is 5. GCS motor subscore is 6.     Comments: Fluent speech, no facial droop.  Psychiatric:        Behavior: Behavior normal.     ED Results / Procedures / Treatments   Labs (all labs ordered are listed, but only abnormal results are displayed) Labs Reviewed  CBC WITH DIFFERENTIAL/PLATELET - Abnormal; Notable for the following components:      Result Value   WBC 16.8 (*)    Neutro Abs 14.6 (*)    Abs Immature Granulocytes 0.09 (*)    All other components within normal limits  BASIC METABOLIC PANEL - Abnormal; Notable for the following components:   Glucose, Bld 121 (*)    Calcium 8.7 (*)    All other components within normal limits  D-DIMER, QUANTITATIVE (NOT AT [facility])  TROPONIN I (HIGH SENSITIVITY)    EKG EKG Interpretation  Date/Time:  Thursday February 05 2020 13:15:17 EDT Ventricular Rate:  91 PR  Interval:  182 QRS Duration: 96 QT Interval:  350 QTC Calculation: 430 R Axis:   50 Text Interpretation: Normal sinus rhythm Nonspecific T wave abnormality Abnormal ECG No significant change since last tracing Confirmed by Gareth Morgan [phone]) on 02/05/2020 2:31:30 PM   Radiology DG Chest 2 View  Result Date: 02/05/2020 CLINICAL DATA:  Eye surgery at [facility] yesterday. Short of breath and a little cough. EXAM: CHEST - 2 VIEW COMPARISON:  None. FINDINGS: The heart size and mediastinal contours are within normal limits. Mildly increased interstitial markings bilaterally. No focal consolidation. No pneumothorax or pleural effusion. No acute finding in the visualized skeleton. IMPRESSION: Mildly increased interstitial markings bilaterally could reflect reactive airways disease or atypical/viral infection. Electronically Signed   By: Audie Pinto M.D.   On: 02/05/2020 14:44    Procedures Procedures (including critical care time)  Medications Ordered in ED Medications  acetaminophen (TYLENOL) tablet 650 mg (650 mg Oral Given 02/05/20 1424)    ED Course  I have reviewed the triage vital signs and the nursing notes.  Pertinent labs & imaging results that were available during my care of the patient were reviewed by me and considered in my medical decision making (see chart for details).    MDM Rules/Calculators/A&P                          History provided by patient and parent with additional history obtained from chart review.    34 yo male presents with shortness of breath and chest pain x 1 day.  Patient nontoxic appearing, in no apparent distress, vitals without significant abnormality. On exam he is well appearing. He has dressing taped over left eye from procedure yesterday that is clean/dry/intact.  Fairly benign physical exam. DDX: ACS, pulmonary embolism, dissection, pneumothorax, effusion, infiltrate, arrhythmia, anemia, electrolyte derangement, MSK. Evaluation initiated with  labs, EKG, and CXR. Patient on cardiac monitor.   Work-up in the ER unremarkable. Labs reviewed. He has leukocytosis of 16.8, likely related to current steroid use. He has no cough or infectious symptoms.  No anemia, or significant electrolyte abnormality. D dimer negative. CXR without infiltrate, effusion, pneumothorax, or fracture/dislocation.   Low risk heart score of 2, EKG without obvious ischemia, troponin negative, doubt ACS. Patient has negative dimer, doubt pulmonary embolism.   Pain is not a tearing sensation, symmetric pulses, no widening of mediastinum on CXR, doubt dissection. Cardiac monitor reviewed, no notable arrhythmias or tachycardia. Patient has appeared hemodynamically stable throughout ER visit and appears safe for discharge with close pcp follow up. I discussed results, treatment plan, need for PCP follow-up, and return precautions with the patient. Provided opportunity for questions, patient confirmed understanding and is in agreement with plan.  Case has been discussed with by Dr. Billy Fischer who agrees with the above plan to discharge.    Portions of this note were generated with [employer]. Dictation errors may occur despite best attempts at proofreading.  Final Clinical Impression(s) / ED Diagnoses Final diagnoses:  SOB (shortness of breath)    Rx / DC Orders ED Discharge Orders    None       Cherre Robins, PA-C 02/05/20 1738    Gareth Morgan, MD 02/05/20 2307

## 2020-02-05 NOTE — Discharge Instructions (Addendum)
You have been seen today for shortness of breath and chest pain. Please read and follow all provided instructions. Return to the emergency room for worsening condition or new concerning symptoms.    Your work up today was reassuring.  1. Medications:  Continue steroids as prescribed by the eye doctor. Also continue pain medicine as prescribed.  Continue usual home medications Take medications as prescribed. Please review all of the medicines and only take them if you do not have an allergy to them.   2. Treatment: rest, drink plenty of fluids  3. Follow Up:  Please follow up with primary care provider by scheduling an appointment as soon as possible for a visit     It is also a possibility that you have an allergic reaction to any of the medicines that you have been prescribed - Everybody reacts differently to medications and while MOST people have no trouble with most medicines, you may have a reaction such as nausea, vomiting, rash, swelling, shortness of breath. If this is the case, please stop taking the medicine immediately and contact your physician.  ?

## 2020-02-06 ENCOUNTER — Emergency Department (HOSPITAL_COMMUNITY)
Admission: EM | Admit: 2020-02-06 | Discharge: 2020-02-06 | Disposition: A | Discharge status: Home / Self Care | Payer: Medicare Other | Source: Home / Self Care | Attending: Emergency Medicine | Admitting: Emergency Medicine

## 2020-02-06 ENCOUNTER — Encounter (HOSPITAL_COMMUNITY): Payer: Self-pay | Admitting: Emergency Medicine

## 2020-02-06 ENCOUNTER — Emergency Department (HOSPITAL_COMMUNITY): Payer: Medicare Other

## 2020-02-06 DIAGNOSIS — R0602 Shortness of breath: Secondary | ICD-10-CM | POA: Diagnosis not present

## 2020-02-06 DIAGNOSIS — R112 Nausea with vomiting, unspecified: Secondary | ICD-10-CM | POA: Diagnosis not present

## 2020-02-06 DIAGNOSIS — R519 Headache, unspecified: Secondary | ICD-10-CM | POA: Diagnosis not present

## 2020-02-06 DIAGNOSIS — R1110 Vomiting, unspecified: Secondary | ICD-10-CM | POA: Diagnosis not present

## 2020-02-06 DIAGNOSIS — Z20822 Contact with and (suspected) exposure to covid-19: Secondary | ICD-10-CM | POA: Diagnosis not present

## 2020-02-06 LAB — COMPREHENSIVE METABOLIC PANEL
ALT: 31 U/L (ref 0–44)
AST: 33 U/L (ref 15–41)
Albumin: 4 g/dL (ref 3.5–5.0)
Alkaline Phosphatase: 104 U/L (ref 38–126)
Anion gap: 13 (ref 5–15)
BUN: 12 mg/dL (ref 6–20)
CO2: 26 mmol/L (ref 22–32)
Calcium: 8.9 mg/dL (ref 8.9–10.3)
Chloride: 101 mmol/L (ref 98–111)
Creatinine, Ser: 0.9 mg/dL (ref 0.61–1.24)
GFR calc Af Amer: >60 mL/min (ref >60)
GFR calc non Af Amer: >60 mL/min (ref >60)
Glucose, Bld: 127 mg/dL — ABNORMAL HIGH (ref 70–99)
Potassium: 4.1 mmol/L (ref 3.5–5.1)
Sodium: 140 mmol/L (ref 135–145)
Total Bilirubin: 0.4 mg/dL (ref 0.3–1.2)
Total Protein: 7.8 g/dL (ref 6.5–8.1)

## 2020-02-06 LAB — LIPASE, BLOOD: Lipase: 23 U/L (ref 11–51)

## 2020-02-06 LAB — CBC WITH DIFFERENTIAL/PLATELET
Abs Immature Granulocytes: 0.05 10*3/uL (ref 0.00–0.07)
Basophils Absolute: 0 10*3/uL (ref 0.0–0.1)
Basophils Relative: 0 %
Eosinophils Absolute: 0 10*3/uL (ref 0.0–0.5)
Eosinophils Relative: 0 %
HCT: 44.6 % (ref 39.0–52.0)
Hemoglobin: 14.8 g/dL (ref 13.0–17.0)
Immature Granulocytes: 0 %
Lymphocytes Relative: 11 %
Lymphs Abs: 1.8 10*3/uL (ref 0.7–4.0)
MCH: 28.8 pg (ref 26.0–34.0)
MCHC: 33.2 g/dL (ref 30.0–36.0)
MCV: 86.9 fL (ref 80.0–100.0)
Monocytes Absolute: 0.8 10*3/uL (ref 0.1–1.0)
Monocytes Relative: 5 %
Neutro Abs: 13.1 10*3/uL — ABNORMAL HIGH (ref 1.7–7.7)
Neutrophils Relative %: 84 %
Platelets: 314 10*3/uL (ref 150–400)
RBC: 5.13 MIL/uL (ref 4.22–5.81)
RDW: 13.6 % (ref 11.5–15.5)
WBC: 15.9 10*3/uL — ABNORMAL HIGH (ref 4.0–10.5)
nRBC: 0 % (ref 0.0–0.2)

## 2020-02-06 LAB — SARS CORONAVIRUS 2 BY RT PCR (HOSPITAL ORDER, PERFORMED IN ~~LOC~~ HOSPITAL LAB): SARS Coronavirus 2: NEGATIVE

## 2020-02-06 MED ORDER — FAMOTIDINE IN NACL 20-0.9 MG/50ML-% IV SOLN
20.0000 mg | Freq: Once | INTRAVENOUS | Status: AC
  Administered 2020-02-06: 20 mg via INTRAVENOUS
  Filled 2020-02-06: qty 50

## 2020-02-06 MED ORDER — METOCLOPRAMIDE HCL 5 MG/ML IJ SOLN
10.0000 mg | Freq: Once | INTRAMUSCULAR | Status: AC
  Administered 2020-02-06: 10 mg via INTRAVENOUS
  Filled 2020-02-06: qty 2

## 2020-02-06 MED ORDER — PROMETHAZINE HCL 25 MG/ML IJ SOLN
25.0000 mg | Freq: Once | INTRAMUSCULAR | Status: AC
  Administered 2020-02-06: 25 mg via INTRAVENOUS
  Filled 2020-02-06: qty 1

## 2020-02-06 MED ORDER — SODIUM CHLORIDE (PF) 0.9 % IJ SOLN
INTRAMUSCULAR | Status: AC
  Filled 2020-02-06: qty 50

## 2020-02-06 MED ORDER — OXYCODONE-ACETAMINOPHEN 5-325 MG PO TABS
1.0000 | ORAL_TABLET | Freq: Once | ORAL | Status: AC
  Administered 2020-02-06: 1 via ORAL
  Filled 2020-02-06: qty 1

## 2020-02-06 MED ORDER — SODIUM CHLORIDE 0.9 % IV BOLUS
1000.0000 mL | Freq: Once | INTRAVENOUS | Status: AC
  Administered 2020-02-06: 1000 mL via INTRAVENOUS

## 2020-02-06 MED ORDER — ALUM & MAG HYDROXIDE-SIMETH 200-200-20 MG/5ML PO SUSP
30.0000 mL | Freq: Once | ORAL | Status: AC
  Administered 2020-02-06: 30 mL via ORAL
  Filled 2020-02-06: qty 30

## 2020-02-06 MED ORDER — PROMETHAZINE HCL 25 MG RE SUPP
25.0000 mg | Freq: Four times a day (QID) | RECTAL | 0 refills | Status: DC | PRN
Start: 2020-02-06 — End: 2020-12-14

## 2020-02-06 MED ORDER — FENTANYL CITRATE (PF) 100 MCG/2ML IJ SOLN
50.0000 ug | Freq: Once | INTRAMUSCULAR | Status: AC
  Administered 2020-02-06: 50 ug via INTRAVENOUS
  Filled 2020-02-06: qty 2

## 2020-02-06 MED ORDER — ONDANSETRON 4 MG PO TBDP
4.0000 mg | ORAL_TABLET | Freq: Once | ORAL | Status: AC
  Administered 2020-02-06: 4 mg via ORAL
  Filled 2020-02-06: qty 1

## 2020-02-06 MED ORDER — IOHEXOL 300 MG/ML  SOLN
100.0000 mL | Freq: Once | INTRAMUSCULAR | Status: AC | PRN
  Administered 2020-02-06: 100 mL via INTRAVENOUS

## 2020-02-06 NOTE — ED Provider Notes (Signed)
[facility] DEPT Provider Note   CSN: [id number] Arrival date & time: 02/05/20  2352     History Chief Complaint  Patient presents with   Post-op Problem    Caleb Hoover is a 34 y.o. male.  HPI   34 year old male with a history of anxiety, left eye blindness, depression, glaucoma, MR, who present presents to the emergency department today for evaluation of nausea vomiting and constipation.  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 Vickki Muff at [facility] on 02/04/2020.  He was given IV steroids and DC'd with p.o. prednisone.  Mom at bedside and assist with history.  She states that he had difficulty coming out of anesthesia and had multiple episodes of nausea and vomiting.  Since he has been home he has had intermittent episodes of vomiting.  He also was complaining of some chest pain and shortness of breath earlier today after vomiting so he was taken to the med center [facility] emergency department where he had labs, chest x-ray, D-dimer.  Work-up was reassuring and he was discharged thereafter.  After being home he had an episode of vomiting.  He took a dose of tramadol and had another episode of vomiting following this.  He complains of epigastric abdominal pain that seems to be worse after vomiting.  He also has the shortness of breath and a cough after vomiting.  He has had no fevers or urinary symptoms.  His last BM was 2 days ago.  Past Medical History:  Diagnosis Date   Anxiety    Blindness of left eye    hit with paintball at age 40   Chicken pox    Depression    Frequent headaches    Glaucoma    Mental retardation    Has trouble reading     Patient Active Problem List   Diagnosis Date Noted   Adjustment disorder with depressed mood 04/30/2016    Past Surgical History:  Procedure Laterality Date   left eye surgery          Family History  Family history unknown: Yes     Social History   Tobacco Use   Smoking status: Never Smoker   Smokeless tobacco: Never Used  Substance Use Topics   Alcohol use: Yes   Drug use: No    Home Medications Prior to Admission medications   Medication Sig Start Date End Date Taking? Authorizing Provider  ondansetron (ZOFRAN-ODT) 4 MG disintegrating tablet Take 4 mg by mouth every 8 (eight) hours as needed for nausea or vomiting.  02/04/20 02/11/20 Yes [provider]  prednisoLONE acetate (PRED FORTE) 1 % ophthalmic suspension Place 1 drop into both eyes 4 (four) times daily.   Yes [provider]  predniSONE (STERAPRED UNI-PAK 21 TAB) 10 MG (21) TBPK tablet Take 5-60 mg by mouth See admin instructions. 6 tablets day 1, 5 tablets day 2, 4 tablets day 3, 3 tablets day 4, 2 tablets day 5, 1 tablet day 1 02/04/20  Yes [provider]  traMADol (ULTRAM) 50 MG tablet Take 50 mg by mouth every 4 (four) hours as needed for moderate pain.   Yes [provider]  benzonatate (TESSALON) 200 MG capsule Take 1 capsule (200 mg total) by mouth 2 (two) times daily as needed for cough. Patient not taking: Reported on 02/06/2020 04/03/19   Dorothyann Peng, NP  erythromycin ophthalmic ointment Place 1 application into the left eye 4 (four)  times daily.  02/04/20   [provider]  HYDROmorphone (DILAUDID) 4 MG tablet Take 4 mg by mouth every 6 (six) hours as needed for moderate pain or severe pain.  02/04/20 02/09/20  [provider]  predniSONE (DELTASONE) 10 MG tablet 40 mg x 3 days, 20 mg x 3 days, 10 mg x 3 days Patient not taking: Reported on 02/06/2020 04/03/19   Dorothyann Peng, NP  promethazine (PHENERGAN) 25 MG suppository Place 1 suppository (25 mg total) rectally every 6 (six) hours as needed for nausea or vomiting. 02/06/20   Timber Marshman S, PA-C    Allergies    Ceclor [cefaclor]  Review of Systems   Review of Systems  Constitutional: Negative for chills and fever.  HENT: Negative for  ear pain and sore throat.   Eyes: Positive for pain.  Respiratory: Positive for shortness of breath. Negative for cough.   Cardiovascular: Negative for chest pain.  Gastrointestinal: Positive for abdominal pain, constipation, nausea and vomiting. Negative for diarrhea.  Genitourinary: Negative for dysuria and hematuria.  Musculoskeletal: Negative for arthralgias and back pain.  Skin: Negative for color change and rash.  Neurological: Negative for seizures and syncope.  All other systems reviewed and are negative.   Physical Exam Updated Vital Signs BP 133/79    Pulse (!) 50    Temp 98 F (36.7 C) (Oral)    Resp 12    Ht 7' (2.134 m)    Wt 136 kg    SpO2 100%    BMI 29.88 kg/m   Physical Exam Vitals and nursing note reviewed.  Constitutional:      Appearance: He is well-developed.  HENT:     Head: Normocephalic and atraumatic.  Cardiovascular:     Rate and Rhythm: Normal rate and regular rhythm.     Pulses: Normal pulses.     Heart sounds: Normal heart sounds. No murmur heard.   Pulmonary:     Effort: Pulmonary effort is normal. No respiratory distress.     Breath sounds: Normal breath sounds. No wheezing, rhonchi or rales.  Abdominal:     General: Bowel sounds are normal.     Palpations: Abdomen is soft.     Tenderness: There is abdominal tenderness in the epigastric area. There is no guarding or rebound.  Musculoskeletal:     Cervical back: Neck supple.  Skin:    General: Skin is warm and dry.  Neurological:     Mental Status: He is alert.     ED Results / Procedures / Treatments   Labs (all labs ordered are listed, but only abnormal results are displayed) Labs Reviewed  CBC WITH DIFFERENTIAL/PLATELET - Abnormal; Notable for the following components:      Result Value   WBC 15.9 (*)    Neutro Abs 13.1 (*)    All other components within normal limits  COMPREHENSIVE METABOLIC PANEL - Abnormal; Notable for the following components:   Glucose, Bld 127 (*)    All  other components within normal limits  SARS CORONAVIRUS 2 BY RT PCR (HOSPITAL ORDER, [place] [facility])  LIPASE, BLOOD    EKG None  Radiology DG Chest 2 View  Result Date: 02/05/2020 CLINICAL DATA:  Eye surgery at [facility] yesterday. Short of breath and a little cough. EXAM: CHEST - 2 VIEW COMPARISON:  None. FINDINGS: The heart size and mediastinal contours are within normal limits. Mildly increased interstitial markings bilaterally. No focal consolidation. No pneumothorax or pleural effusion. No acute  finding in the visualized skeleton. IMPRESSION: Mildly increased interstitial markings bilaterally could reflect reactive airways disease or atypical/viral infection. Electronically Signed   By: Audie Pinto M.D.   On: 02/05/2020 14:44   CT Head Wo Contrast  Result Date: 02/06/2020 CLINICAL DATA:  Headache. EXAM: CT HEAD WITHOUT CONTRAST TECHNIQUE: Contiguous axial images were obtained from the base of the skull through the vertex without intravenous contrast. COMPARISON:  None. FINDINGS: Brain: No evidence of acute infarction, hemorrhage, hydrocephalus, extra-axial collection or mass lesion/mass effect. Asymmetric lateral ventricular size, larger on the right, developmental appearing with no midline shift or encephalomalacia. Vascular: No hyperdense vessel or unexpected calcification. Skull: Normal. Negative for fracture or focal lesion. Sinuses/Orbits: Left enucleation and prosthesis implant with orbital gas and swelling correlating with recent surgery. IMPRESSION: No acute finding. Recent left enucleation and prosthesis. Electronically Signed   By: Monte Fantasia M.D.   On: 02/06/2020 06:35   CT ABDOMEN PELVIS W CONTRAST  Result Date: 02/06/2020 CLINICAL DATA:  Epigastric abdominal pain with nausea and vomiting. No bowel movement since eye surgery 02/04/2020. Leukocytosis EXAM: CT ABDOMEN AND PELVIS WITH CONTRAST TECHNIQUE: Multidetector CT imaging of the abdomen and pelvis was  performed using the standard protocol following bolus administration of intravenous contrast. CONTRAST:  164mL OMNIPAQUE IOHEXOL 300 MG/ML  SOLN COMPARISON:  None. FINDINGS: Lower chest:  No contributory findings. Hepatobiliary: No focal liver abnormality.No evidence of biliary obstruction or stone. Pancreas: Unremarkable. Spleen: Unremarkable. Adrenals/Urinary Tract: Negative adrenals. No hydronephrosis or stone. Unremarkable bladder. Stomach/Bowel:  No obstruction. No appendicitis. Vascular/Lymphatic: No acute vascular abnormality. No mass or adenopathy. Reproductive:No pathologic findings. Other: No ascites or pneumoperitoneum. Musculoskeletal: No acute abnormalities.  Mild hip spurring. IMPRESSION: Negative abdominal CT. No rectal impaction or excessive stool retention. Electronically Signed   By: Monte Fantasia M.D.   On: 02/06/2020 04:24    Procedures Procedures (including critical care time)  Medications Ordered in ED Medications  sodium chloride (PF) 0.9 % injection (has no administration in time range)  metoCLOPramide (REGLAN) injection 10 mg (10 mg Intravenous Given 02/06/20 0117)  famotidine (PEPCID) IVPB 20 mg premix (0 mg Intravenous Stopped 02/06/20 0230)  alum & mag hydroxide-simeth (MAALOX/MYLANTA) 200-200-20 MG/5ML suspension 30 mL (30 mLs Oral Given 02/06/20 0109)  sodium chloride 0.9 % bolus 1,000 mL (0 mLs Intravenous Stopped 02/06/20 0230)  fentaNYL (SUBLIMAZE) injection 50 mcg (50 mcg Intravenous Given 02/06/20 0235)  iohexol (OMNIPAQUE) 300 MG/ML solution 100 mL (100 mLs Intravenous Contrast Given 02/06/20 0401)  oxyCODONE-acetaminophen (PERCOCET/ROXICET) 5-325 MG per tablet 1 tablet (1 tablet Oral Given 02/06/20 0433)  ondansetron (ZOFRAN-ODT) disintegrating tablet 4 mg (4 mg Oral Given 02/06/20 0442)  promethazine (PHENERGAN) injection 25 mg (25 mg Intravenous Given 02/06/20 0500)    ED Course  I have reviewed the triage vital signs and the nursing notes.  Pertinent labs & imaging  results that were available during my care of the patient were reviewed by me and considered in my medical decision making (see chart for details).    MDM Rules/Calculators/A&P                          34 year old male presenting for evaluation of nausea and vomiting.  Also complaining of burning chest pain, cough and epigastric abdominal Pain occurring after vomiting.  Recent eye surgery.  Seen earlier today at [facility] and had reassuring work-up with negative D-dimer negative troponin.  Chest x-ray showed possible atypical/viral infection.  CBC shows  a leukocytosis at 15.9 but improved from earlier today.  Likely from recent surgery and use of steroids. CMP with normal electrolytes.  Normal liver and kidney function.  Normal bilirubin.  Doubt gallbladder pathology. Lipase is negative Covid negative  CT abd/pelvis - Negative abdominal CT. No rectal impaction or excessive stool retention.  CT head -  No acute finding. Recent left enucleation and prosthesis.   Pt given antiemetics and was able to tolerate PO until given fentanyl. Following this he began dry heaving. He was given additional dosing of antiemetics.  Rechecked pt. He is feeling improved and is no longer nauseated.  He has been able to tolerate fluids and graham crackers.  I suspect symptoms may be related to him taking narcotics.  Advised to try to stick with Tylenol and maybe half the dose of tramadol and give it after antiemetics if necessary.  Will give Rx for Phenergan for home.  Work-up today has been reassuring and see no indication for further work-up or admission.  Advised close follow-up with PCP and/or ophthalmology.  Advised on return precautions.  Mom and patient at bedside voiced understanding plan reasons to return.  Questions answered.  Patient stable for discharge.  Final Clinical Impression(s) / ED Diagnoses Final diagnoses:  Non-intractable vomiting with nausea, unspecified vomiting type    Rx / DC  Orders ED Discharge Orders         Ordered    promethazine (PHENERGAN) 25 MG suppository  Every 6 hours PRN     Discontinue  Reprint     02/06/20 0646           Rodney Booze, PA-C 02/06/20 6568    Ezequiel Essex, MD 02/07/20 [phone]

## 2020-02-06 NOTE — ED Triage Notes (Signed)
Pt had eye surgery at [facility] on 02/04/20. Pt reports he has had no bowel movement since the surgery. Pt is unable to open his right eye. Pt's left eye was removed in surgery. Pt reports pain and vomiting since surgery.

## 2020-02-06 NOTE — ED Notes (Signed)
Pt given water for fluid challenge 

## 2020-02-06 NOTE — Discharge Instructions (Addendum)
You were given a prescription for phenergan  to help with your nausea. Please take as directed.  Please follow up with your primary care provider within 5-7 days for re-evaluation of your symptoms. If you do not have a primary care provider, information for a healthcare clinic has been provided for you to make arrangements for follow up care. Please return to the emergency department for any new or worsening symptoms.

## 2020-04-08 DIAGNOSIS — Z4420 Encounter for fitting and adjustment of artificial eye, unspecified: Secondary | ICD-10-CM | POA: Diagnosis not present

## 2020-07-15 DIAGNOSIS — Z4420 Encounter for fitting and adjustment of artificial eye, unspecified: Secondary | ICD-10-CM | POA: Diagnosis not present

## 2020-07-26 DIAGNOSIS — Q111 Other anophthalmos: Secondary | ICD-10-CM | POA: Diagnosis not present

## 2020-08-23 ENCOUNTER — Telehealth: Payer: Self-pay | Admitting: Adult Health

## 2020-08-23 NOTE — Telephone Encounter (Signed)
Left message for patient to call back and schedule Medicare Annual Wellness Visit (AWV) either virtually or in office.   Last AWV no information  please schedule at anytime with [facility] Nurse Health Advisor 1 or 2   This should be a 45 minute visit.  Patient also needs appointment with PCP last appointment 04/03/2019

## 2020-09-06 ENCOUNTER — Encounter: Payer: Self-pay | Admitting: Family Medicine

## 2020-09-06 ENCOUNTER — Ambulatory Visit (INDEPENDENT_AMBULATORY_CARE_PROVIDER_SITE_OTHER): Payer: Medicare Other | Admitting: Family Medicine

## 2020-09-06 ENCOUNTER — Other Ambulatory Visit: Payer: Self-pay

## 2020-09-06 VITALS — BP 140/82 | HR 71 | Temp 98.3°F | Ht >= 80 in | Wt 344.0 lb

## 2020-09-06 DIAGNOSIS — H6122 Impacted cerumen, left ear: Secondary | ICD-10-CM

## 2020-09-06 NOTE — Progress Notes (Addendum)
   Subjective:    Patient ID: Saverio Kader, male    DOB: 08-01-1985, 35 y.o.   MRN: [id number]  HPI Here for the sudden loss of hearing in the left era. He woke up this way today. There is no pain or sinus congestion. He has a hx of wax build ups in the ears.    Review of Systems  Constitutional: Negative.   HENT: Positive for hearing loss. Negative for congestion, ear discharge, ear pain and facial swelling.   Eyes: Negative.   Respiratory: Negative.        Objective:   Physical Exam Constitutional:      Appearance: Normal appearance.  HENT:     Right Ear: Tympanic membrane, ear canal and external ear normal.     Left Ear: There is impacted cerumen.     Nose: Nose normal.     Mouth/Throat:     Pharynx: Oropharynx is clear.  Eyes:     Conjunctiva/sclera: Conjunctivae normal.  Pulmonary:     Effort: Pulmonary effort is normal.     Breath sounds: Normal breath sounds.  Lymphadenopathy:     Cervical: No cervical adenopathy.  Neurological:     Mental Status: He is alert.           Assessment & Plan:  Cerumen impaction. We obtained his consent to irrigate this with water. This was successful, and he tolerated it well.  Alysia Penna, MD

## 2020-11-22 ENCOUNTER — Ambulatory Visit: Payer: Medicare Other

## 2020-11-22 NOTE — Progress Notes (Signed)
This note was opened in error please disreguard

## 2020-12-14 ENCOUNTER — Ambulatory Visit (INDEPENDENT_AMBULATORY_CARE_PROVIDER_SITE_OTHER): Payer: Medicare Other

## 2020-12-14 ENCOUNTER — Other Ambulatory Visit: Payer: Self-pay

## 2020-12-14 DIAGNOSIS — Z0000 Encounter for general adult medical examination without abnormal findings: Secondary | ICD-10-CM
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
## 2020-12-14 NOTE — Patient Instructions (Addendum)
Mr. Caleb Hoover , Thank you for taking time to come for your Medicare Wellness Visit. I appreciate your ongoing commitment to your health goals. Please review the following plan we discussed and let me know if I can assist you in the future.   Screening recommendations/referrals: Recommended yearly ophthalmology/optometry visit for glaucoma screening and checkup Recommended yearly dental visit for hygiene and checkup  Vaccinations: Influenza vaccine: Due and discussed Tdap vaccine: Due and discussed Covid-19: Completed 4/14, 5/12, & 06/08/20  Advanced directives: Advance directive discussed with you today. Even though you declined this today please call our office should you change your mind and we can give you the proper paperwork for you to fill out.  Conditions/risks identified: None at this time   Next appointment: Follow up in one year for your annual wellness visit   Preventive Care  Male Preventive care refers to lifestyle choices and visits with your health care provider that can promote health and wellness. What does preventive care include?  A yearly physical exam. This is also called an annual well check.  Dental exams once or twice a year.  Routine eye exams. Ask your health care provider how often you should have your eyes checked.  Personal lifestyle choices, including:  Daily care of your teeth and gums.  Regular physical activity.  Eating a healthy diet.  Avoiding tobacco and drug use.  Limiting alcohol use.  Practicing safe sex. What happens during an annual well check? The services and screenings done by your health care provider during your annual well check will depend on your age, overall health, lifestyle risk factors, and family history of disease. Counseling  Your health care provider may ask you questions about your:  Alcohol use.  Tobacco use.  Drug use.  Emotional well-being.  Home and relationship well-being.  Sexual activity.  Eating  habits.  Work and work Statistician. Screening  You may have the following tests or measurements:  Height, weight, and BMI.  Blood pressure.  Lipid and cholesterol levels. These may be checked every 5 years, or more frequently if you are over 32 years old.  Skin check.  Lung cancer screening. You may have this screening every year starting at age 12 if you have a 30-pack-year history of smoking and currently smoke or have quit within the past 15 years.  Fecal occult blood test (FOBT) of the stool. You may have this test every year starting at age 27.  Flexible sigmoidoscopy or colonoscopy. You may have a sigmoidoscopy every 5 years or a colonoscopy every 10 years starting at age 40.  Prostate cancer screening. Recommendations will vary depending on your family history and other risks.  Hepatitis C blood test.  Hepatitis B blood test.  Sexually transmitted disease (STD) testing.  Diabetes screening. This is done by checking your blood sugar (glucose) after you have not eaten for a while (fasting). You may have this done every 1-3 years. Discuss your test results, treatment options, and if necessary, the need for more tests with your health care provider. Vaccines  Your health care provider may recommend certain vaccines, such as:  Influenza vaccine. This is recommended every year.  Tetanus, diphtheria, and acellular pertussis (Tdap, Td) vaccine. You may need a Td booster every 10 years.  Zoster vaccine. You may need this after age 75.  Pneumococcal 13-valent conjugate (PCV13) vaccine. You may need this if you have certain conditions and have not been vaccinated.  Pneumococcal polysaccharide (PPSV23) vaccine. You may need one or two  doses if you smoke cigarettes or if you have certain conditions. Talk to your health care provider about which screenings and vaccines you need and how often you need them. This information is not intended to replace advice given to you by your  health care provider. Make sure you discuss any questions you have with your health care provider. Document Released: 08/13/2015 Document Revised: 04/05/2016 Document Reviewed: 05/18/2015 Elsevier Interactive Patient Education  2017 [place] Prevention in the Home Falls can cause injuries. They can happen to people of all ages. There are many things you can do to make your home safe and to help prevent falls. What can I do on the outside of my home?  Regularly fix the edges of walkways and driveways and fix any cracks.  Remove anything that might make you trip as you walk through a door, such as a raised step or threshold.  Trim any bushes or trees on the path to your home.  Use bright outdoor lighting.  Clear any walking paths of anything that might make someone trip, such as rocks or tools.  Regularly check to see if handrails are loose or broken. Make sure that both sides of any steps have handrails.  Any raised decks and porches should have guardrails on the edges.  Have any leaves, snow, or ice cleared regularly.  Use sand or salt on walking paths during winter.  Clean up any spills in your garage right away. This includes oil or grease spills. What can I do in the bathroom?  Use night lights.  Install grab bars by the toilet and in the tub and shower. Do not use towel bars as grab bars.  Use non-skid mats or decals in the tub or shower.  If you need to sit down in the shower, use a plastic, non-slip stool.  Keep the floor dry. Clean up any water that spills on the floor as soon as it happens.  Remove soap buildup in the tub or shower regularly.  Attach bath mats securely with double-sided non-slip rug tape.  Do not have throw rugs and other things on the floor that can make you trip. What can I do in the bedroom?  Use night lights.  Make sure that you have a light by your bed that is easy to reach.  Do not use any sheets or blankets that are too big  for your bed. They should not hang down onto the floor.  Have a firm chair that has side arms. You can use this for support while you get dressed.  Do not have throw rugs and other things on the floor that can make you trip. What can I do in the kitchen?  Clean up any spills right away.  Avoid walking on wet floors.  Keep items that you use a lot in easy-to-reach places.  If you need to reach something above you, use a strong step stool that has a grab bar.  Keep electrical cords out of the way.  Do not use floor polish or wax that makes floors slippery. If you must use wax, use non-skid floor wax.  Do not have throw rugs and other things on the floor that can make you trip. What can I do with my stairs?  Do not leave any items on the stairs.  Make sure that there are handrails on both sides of the stairs and use them. Fix handrails that are broken or loose. Make sure that handrails are as  long as the stairways.  Check any carpeting to make sure that it is firmly attached to the stairs. Fix any carpet that is loose or worn.  Avoid having throw rugs at the top or bottom of the stairs. If you do have throw rugs, attach them to the floor with carpet tape.  Make sure that you have a light switch at the top of the stairs and the bottom of the stairs. If you do not have them, ask someone to add them for you. What else can I do to help prevent falls?  Wear shoes that:  Do not have high heels.  Have rubber bottoms.  Are comfortable and fit you well.  Are closed at the toe. Do not wear sandals.  If you use a stepladder:  Make sure that it is fully opened. Do not climb a closed stepladder.  Make sure that both sides of the stepladder are locked into place.  Ask someone to hold it for you, if possible.  Clearly mark and make sure that you can see:  Any grab bars or handrails.  First and last steps.  Where the edge of each step is.  Use tools that help you move around  (mobility aids) if they are needed. These include:  Canes.  Walkers.  Scooters.  Crutches.  Turn on the lights when you go into a dark area. Replace any light bulbs as soon as they burn out.  Set up your furniture so you have a clear path. Avoid moving your furniture around.  If any of your floors are uneven, fix them.  If there are any pets around you, be aware of where they are.  Review your medicines with your doctor. Some medicines can make you feel dizzy. This can increase your chance of falling. Ask your doctor what other things that you can do to help prevent falls. This information is not intended to replace advice given to you by your health care provider. Make sure you discuss any questions you have with your health care provider. Document Released: 05/13/2009 Document Revised: 12/23/2015 Document Reviewed: 08/21/2014 Elsevier Interactive Patient Education  2017 [employer].

## 2020-12-24 ENCOUNTER — Ambulatory Visit (INDEPENDENT_AMBULATORY_CARE_PROVIDER_SITE_OTHER): Payer: Medicare Other | Admitting: Adult Health

## 2020-12-24 ENCOUNTER — Encounter: Payer: Self-pay | Admitting: Adult Health

## 2020-12-24 ENCOUNTER — Other Ambulatory Visit: Payer: Self-pay

## 2020-12-24 VITALS — BP 130/90 | HR 60 | Temp 98.6°F | Ht >= 80 in | Wt 339.0 lb

## 2020-12-24 DIAGNOSIS — E7439 Other disorders of intestinal carbohydrate absorption: Secondary | ICD-10-CM | POA: Diagnosis not present

## 2020-12-24 DIAGNOSIS — E668 Other obesity: Secondary | ICD-10-CM

## 2020-12-24 DIAGNOSIS — E782 Mixed hyperlipidemia: Secondary | ICD-10-CM | POA: Diagnosis not present

## 2020-12-24 DIAGNOSIS — Z1159 Encounter for screening for other viral diseases: Secondary | ICD-10-CM

## 2020-12-24 DIAGNOSIS — Z0000 Encounter for general adult medical examination without abnormal findings: Secondary | ICD-10-CM | POA: Diagnosis not present

## 2020-12-24 DIAGNOSIS — Z114 Encounter for screening for human immunodeficiency virus [HIV]: Secondary | ICD-10-CM

## 2020-12-24 LAB — CBC WITH DIFFERENTIAL/PLATELET
Basophils Absolute: 0 10*3/uL (ref 0.0–0.1)
Basophils Relative: 0.5 % (ref 0.0–3.0)
Eosinophils Absolute: 0.1 10*3/uL (ref 0.0–0.7)
Eosinophils Relative: 0.8 % (ref 0.0–5.0)
HCT: 41.8 % (ref 39.0–52.0)
Hemoglobin: 14.3 g/dL (ref 13.0–17.0)
Lymphocytes Relative: 31 % (ref 12.0–46.0)
Lymphs Abs: 2.1 10*3/uL (ref 0.7–4.0)
MCHC: 34.2 g/dL (ref 30.0–36.0)
MCV: 84.3 fl (ref 78.0–100.0)
Monocytes Absolute: 0.6 10*3/uL (ref 0.1–1.0)
Monocytes Relative: 8 % (ref 3.0–12.0)
Neutro Abs: 4.1 10*3/uL (ref 1.4–7.7)
Neutrophils Relative %: 59.7 % (ref 43.0–77.0)
Platelets: 272 10*3/uL (ref 150.0–400.0)
RBC: 4.96 Mil/uL (ref 4.22–5.81)
RDW: 13.7 % (ref 11.5–15.5)
WBC: 6.9 10*3/uL (ref 4.0–10.5)

## 2020-12-24 LAB — TSH: TSH: 2.55 u[IU]/mL (ref 0.35–4.50)

## 2020-12-24 LAB — COMPREHENSIVE METABOLIC PANEL
ALT: 26 U/L (ref 0–53)
AST: 19 U/L (ref 0–37)
Albumin: 4.1 g/dL (ref 3.5–5.2)
Alkaline Phosphatase: 106 U/L (ref 39–117)
BUN: 14 mg/dL (ref 6–23)
CO2: 24 mEq/L (ref 19–32)
Calcium: 8.5 mg/dL (ref 8.4–10.5)
Chloride: 105 mEq/L (ref 96–112)
Creatinine, Ser: 0.92 mg/dL (ref 0.40–1.50)
GFR: 108.96 mL/min (ref >60.00)
Glucose, Bld: 102 mg/dL — ABNORMAL HIGH (ref 70–99)
Potassium: 3.8 mEq/L (ref 3.5–5.1)
Sodium: 139 mEq/L (ref 135–145)
Total Bilirubin: 0.5 mg/dL (ref 0.2–1.2)
Total Protein: 6.8 g/dL (ref 6.0–8.3)

## 2020-12-24 LAB — LIPID PANEL
Cholesterol: 243 mg/dL — ABNORMAL HIGH (ref 0–200)
HDL: 39.2 mg/dL (ref >39.00)
LDL Cholesterol: 176 mg/dL — ABNORMAL HIGH (ref 0–99)
NonHDL: 203.85
Total CHOL/HDL Ratio: 6
Triglycerides: 138 mg/dL (ref 0.0–149.0)
VLDL: 27.6 mg/dL (ref 0.0–40.0)

## 2020-12-24 LAB — HEMOGLOBIN A1C: Hgb A1c MFr Bld: 6.5 % (ref 4.6–6.5)

## 2020-12-24 NOTE — Progress Notes (Signed)
Subjective:    Patient ID: Caleb Hoover, male    DOB: May 13, 1986, 35 y.o.   MRN: [id number]  HPI Patient presents for yearly preventative medicine examination. Pleasant 35 year old male who  has a past medical history of Anxiety, Blindness of left eye, Chicken pox, Depression, Frequent headaches, Glaucoma, and Mental retardation.  Last CPE in 2018.   Glucose Intolerance  Lab Results  Component Value Date   HGBA1C 5.8 03/06/2017   Hyperlipidemia - not on medication currently  Lab Results  Component Value Date   CHOL 210 (H) 03/06/2017   HDL 45.90 03/06/2017   LDLCALC 145 (H) 03/06/2017   TRIG 95.0 03/06/2017   CHOLHDL 5 03/06/2017   Obesity - He has started exercising again. Doing a lot of weight lifting and some aerobic exercises Wt Readings from Last 3 Encounters:  12/24/20 (!) 339 lb (153.8 kg)  09/06/20 (!) 344 lb (156 kg)  02/06/20 299 lb 13.2 oz (136 kg)   All immunizations and health maintenance protocols were reviewed with the patient and needed orders were placed.  Appropriate screening laboratory values were ordered for the patient including screening of hyperlipidemia, renal function and hepatic function.  Medication reconciliation,  past medical history, social history, problem list and allergies were reviewed in detail with the patient  Goals were established with regard to weight loss, exercise, and  diet in compliance with medications   Review of Systems  Constitutional: Negative.   HENT: Negative.   Eyes: Negative.   Respiratory: Negative.   Cardiovascular: Negative.   Gastrointestinal: Negative.   Endocrine: Negative.   Genitourinary: Negative.   Musculoskeletal: Negative.   Skin: Negative.   Allergic/Immunologic: Negative.   Neurological: Negative.   Hematological: Negative.   Psychiatric/Behavioral: Negative.   All other systems reviewed and are negative.  Past Medical History:  Diagnosis Date  . Anxiety   . Blindness of left eye     hit with paintball at age 70  . Chicken pox   . Depression   . Frequent headaches   . Glaucoma   . Mental retardation    Has trouble reading     Social History   Socioeconomic History  . Marital status: Single    Spouse name: Not on file  . Number of children: Not on file  . Years of education: Not on file  . Highest education level: Not on file  Occupational History  . Not on file  Tobacco Use  . Smoking status: Never Smoker  . Smokeless tobacco: Never Used  Vaping Use  . Vaping Use: Never used  Substance and Sexual Activity  . Alcohol use: Yes  . Drug use: No  . Sexual activity: Yes  Other Topics Concern  . Not on file  Social History Narrative  . Not on file   Social Determinants of Health   Financial Resource Strain: Low Risk   . Difficulty of Paying Living Expenses: Not hard at all  Food Insecurity: No Food Insecurity  . Worried About Charity fundraiser in the Last Year: Never true  . Ran Out of Food in the Last Year: Never true  Transportation Needs: No Transportation Needs  . Lack of Transportation (Medical): No  . Lack of Transportation (Non-Medical): No  Physical Activity: Sufficiently Active  . Days of Exercise per Week: 5 days  . Minutes of Exercise per Session: 30 min  Stress: No Stress Concern Present  . Feeling of Stress : Not at all  Social  Connections: Socially Isolated  . Frequency of Communication with Friends and Family: Once a week  . Frequency of Social Gatherings with Friends and Family: Never  . Attends Religious Services: 1 to 4 times per year  . Active Member of Clubs or Organizations: No  . Attends Archivist Meetings: Never  . Marital Status: Never married  Intimate Partner Violence: Not At Risk  . Fear of Current or Ex-Partner: No  . Emotionally Abused: No  . Physically Abused: No  . Sexually Abused: No    Past Surgical History:  Procedure Laterality Date  . left eye surgery       Family History  Family  history unknown: Yes    Allergies  Allergen Reactions  . Ceclor [Cefaclor] Anaphylaxis, Hives and Other (See Comments)    Childhood allergy.   . Oxycodone Shortness Of Breath and Nausea And Vomiting    Current Outpatient Medications on File Prior to Visit  Medication Sig Dispense Refill  . Turmeric (QC TUMERIC COMPLEX PO) Take by mouth.     No current facility-administered medications on file prior to visit.    BP 130/90   Pulse 60   Temp 98.6 F (37 C) (Oral)   Ht 6' 10.75" (2.102 m)   Wt (!) 339 lb (153.8 kg)   SpO2 99%   BMI 34.81 kg/m       Objective:   Physical Exam Vitals and nursing note reviewed.  Constitutional:      General: He is not in acute distress.    Appearance: Normal appearance. He is well-developed. He is obese.  HENT:     Head: Normocephalic and atraumatic.     Right Ear: Tympanic membrane, ear canal and external ear normal. There is no impacted cerumen.     Left Ear: Tympanic membrane, ear canal and external ear normal. There is no impacted cerumen.     Nose: Nose normal. No congestion or rhinorrhea.     Mouth/Throat:     Mouth: Mucous membranes are moist.     Pharynx: Oropharynx is clear. No oropharyngeal exudate or posterior oropharyngeal erythema.  Eyes:     General:        Right eye: No discharge.        Left eye: No discharge.     Extraocular Movements: Extraocular movements intact.     Conjunctiva/sclera: Conjunctivae normal.     Pupils: Pupils are equal, round, and reactive to light.  Neck:     Vascular: No carotid bruit.     Trachea: No tracheal deviation.  Cardiovascular:     Rate and Rhythm: Normal rate and regular rhythm.     Pulses: Normal pulses.     Heart sounds: Normal heart sounds. No murmur heard. No friction rub. No gallop.   Pulmonary:     Effort: Pulmonary effort is normal. No respiratory distress.     Breath sounds: Normal breath sounds. No stridor. No wheezing, rhonchi or rales.  Chest:     Chest wall: No  tenderness.  Abdominal:     General: Bowel sounds are normal. There is no distension.     Palpations: Abdomen is soft. There is no mass.     Tenderness: There is no abdominal tenderness. There is no right CVA tenderness, left CVA tenderness, guarding or rebound.     Hernia: No hernia is present.  Musculoskeletal:        General: No swelling, tenderness, deformity or signs of injury. Normal range of motion.  Right lower leg: No edema.     Left lower leg: No edema.  Lymphadenopathy:     Cervical: No cervical adenopathy.  Skin:    General: Skin is warm and dry.     Capillary Refill: Capillary refill takes less than 2 seconds.     Coloration: Skin is not jaundiced or pale.     Findings: No bruising, erythema, lesion or rash.  Neurological:     General: No focal deficit present.     Mental Status: He is alert and oriented to person, place, and time.     Cranial Nerves: No cranial nerve deficit.     Sensory: No sensory deficit.     Motor: No weakness.     Coordination: Coordination normal.     Gait: Gait normal.     Deep Tendon Reflexes: Reflexes normal.  Psychiatric:        Mood and Affect: Mood normal.        Behavior: Behavior normal.        Thought Content: Thought content normal.        Judgment: Judgment normal.       Assessment & Plan:  1. Routine general medical examination at a health care facility - Needs significant weight loss  - Follow up in one year or sooner if needed - CBC with Differential/Platelet; Future - Comprehensive metabolic panel; Future - Hemoglobin A1c; Future - Lipid panel; Future - TSH; Future  2. Glucose intolerance - Consider Metformin  - CBC with Differential/Platelet; Future - Comprehensive metabolic panel; Future - Hemoglobin A1c; Future - Lipid panel; Future - TSH; Future  3. Mixed hyperlipidemia - Consider statin  - CBC with Differential/Platelet; Future - Comprehensive metabolic panel; Future - Hemoglobin A1c; Future - Lipid  panel; Future - TSH; Future  4. Other obesity - Continue with weight loss measures - CBC with Differential/Platelet; Future - Comprehensive metabolic panel; Future - Hemoglobin A1c; Future - Lipid panel; Future - TSH; Future  5. Encounter for screening for HIV  - HIV Antibody (routine testing w rflx); Future  6. Need for hepatitis C screening test  - Hep C Antibody; Future  Dorothyann Peng, NP

## 2020-12-24 NOTE — Addendum Note (Signed)
Addended by: Tessie Fass D on: 12/24/2020 07:56 AM   Modules accepted: Orders

## 2020-12-24 NOTE — Patient Instructions (Signed)
It was great seeing you today   We will follow up with you regarding your lab work   Please continue to work on weight loss  I will see you back in one year or sooner if needed

## 2020-12-28 ENCOUNTER — Other Ambulatory Visit: Payer: Self-pay

## 2020-12-28 LAB — HEPATITIS C ANTIBODY
Hepatitis C Ab: NONREACTIVE
SIGNAL TO CUT-OFF: 0.01 (ref <1.00)

## 2020-12-28 LAB — HIV ANTIBODY (ROUTINE TESTING W REFLEX): HIV 1&2 Ab, 4th Generation: NONREACTIVE

## 2020-12-28 MED ORDER — METFORMIN HCL 500 MG PO TABS: 500.0000 mg | ORAL_TABLET | Freq: Two times a day (BID) | ORAL | 3 refills | Status: DC

## 2020-12-28 MED ORDER — ATORVASTATIN CALCIUM 10 MG PO TABS: 10.0000 mg | ORAL_TABLET | Freq: Every day | ORAL | 3 refills | Status: DC

## 2020-12-28 NOTE — Addendum Note (Signed)
Addended by: Gwenyth Ober R on: 12/28/2020 04:00 PM   Modules accepted: Orders

## 2021-01-21 DIAGNOSIS — Z4420 Encounter for fitting and adjustment of artificial eye, unspecified: Secondary | ICD-10-CM | POA: Diagnosis not present

## 2021-04-13 ENCOUNTER — Telehealth: Payer: Self-pay

## 2021-04-13 NOTE — Telephone Encounter (Signed)
Linda from [facility] called needing clarification for patient Rx  metFORMIN (GLUCOPHAGE) 500 MG tablet atorvastatin (LIPITOR) 10 MG tablet  Call back # [phone]

## 2021-04-14 NOTE — Telephone Encounter (Signed)
Please advise 

## 2021-04-14 NOTE — Telephone Encounter (Signed)
Caleb Hoover called to talk to Caleb Hoover. Relayed the info that was noted by [facility] to her and she advise would contact PT to let them know.

## 2021-04-14 NOTE — Telephone Encounter (Signed)
Spoke to [place] and she advised that pt stopped both of these medications. Vaughan Basta want to see if pt needs to continue medications.

## 2021-04-14 NOTE — Telephone Encounter (Signed)
Noted! Lm Theresa Duty to return call.

## 2021-04-15 NOTE — Telephone Encounter (Signed)
Noted  

## 2021-05-27 ENCOUNTER — Telehealth: Payer: Self-pay

## 2021-05-27 NOTE — Telephone Encounter (Signed)
Barnett Applebaum called asking for documentation for patients disability eligibility. Barnett Applebaum would like a call back to discuss [phone]

## 2021-05-27 NOTE — Telephone Encounter (Signed)
Do you recall?

## 2021-05-27 NOTE — Telephone Encounter (Signed)
Message routed to PCP.

## 2021-06-01 ENCOUNTER — Encounter: Payer: Self-pay | Admitting: Adult Health

## 2021-06-01 NOTE — Telephone Encounter (Signed)
Left detailed message informing pt mother of update. Letter placed in front office filing cabinet.

## 2021-06-01 NOTE — Telephone Encounter (Addendum)
Pt mother gina is returning [place] call

## 2021-06-01 NOTE — Telephone Encounter (Signed)
Left message to return phone call.

## 2021-06-01 NOTE — Telephone Encounter (Signed)
Noted  

## 2021-06-01 NOTE — Telephone Encounter (Signed)
Lm for a return call

## 2021-06-01 NOTE — Telephone Encounter (Signed)
Pt mother stated that Caleb Hoover needs a letter for social services stating that pt is mentally retarded and blind in the left eye.

## 2021-07-22 IMAGING — CT CT ABD-PELV W/ CM
2 of 4 series · 17 of 46 positions shown, 19 images · IV contrast (OMNIPAQUE 300)
Comparison: None.

CLINICAL DATA: Epigastric abdominal pain with nausea and vomiting.
No bowel movement since eye surgery 02/04/2020. Leukocytosis

EXAM:
CT ABDOMEN AND PELVIS WITH CONTRAST
TECHNIQUE: Multidetector CT imaging of the abdomen and pelvis was performed
using the standard protocol following bolus administration of
intravenous contrast.
CONTRAST:  100mL OMNIPAQUE IOHEXOL 300 MG/ML  SOLN

[Series 2: axial st · axial · 0.87mm/px · z∈[-561,-61]mm · 14 of 114 slices shown, 16 images]
[im 7/114  soft-tissue]
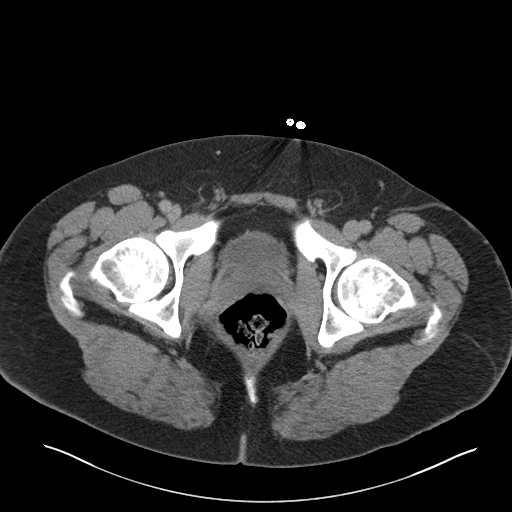
[im 7/114  bone]
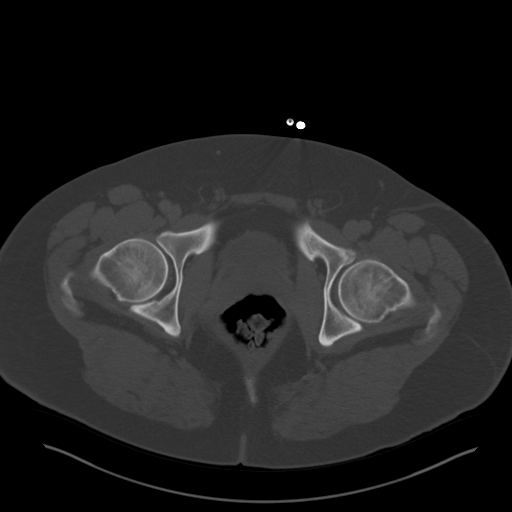
[im 13/114  soft-tissue]
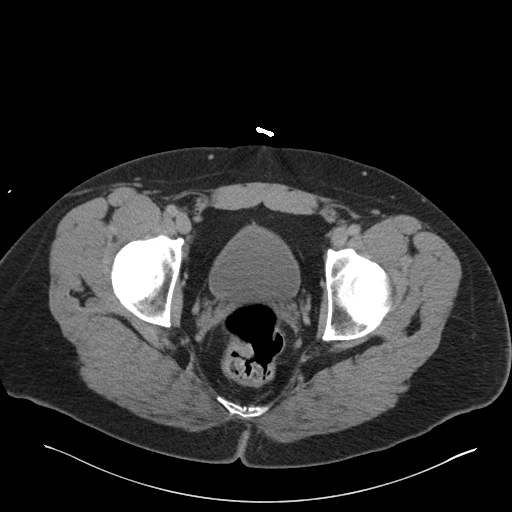
[im 26/114  soft-tissue]
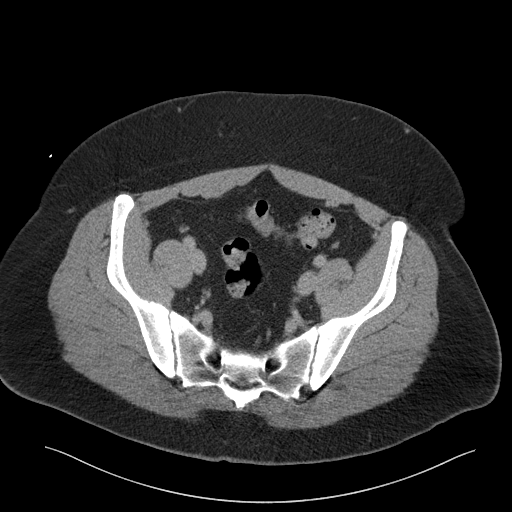
[im 32/114  soft-tissue]
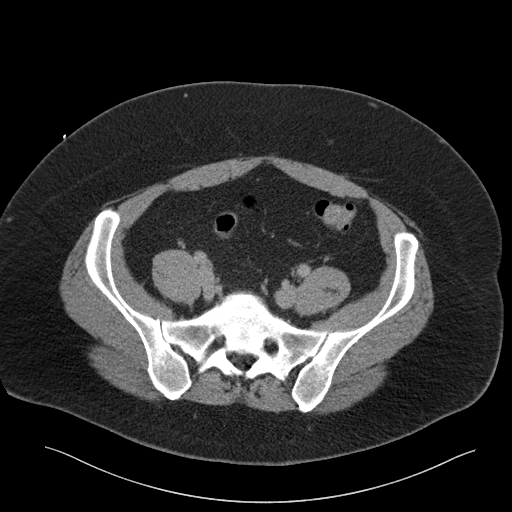
[im 38/114  soft-tissue]
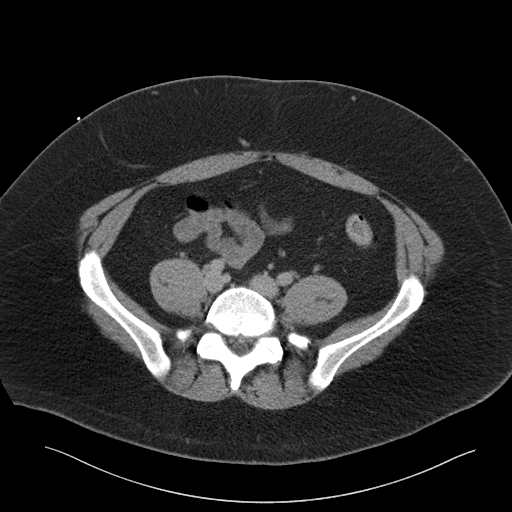
[im 44/114  soft-tissue]
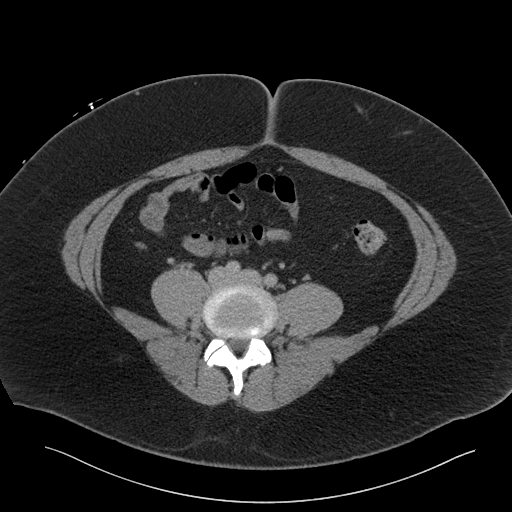
[im 51/114  soft-tissue]
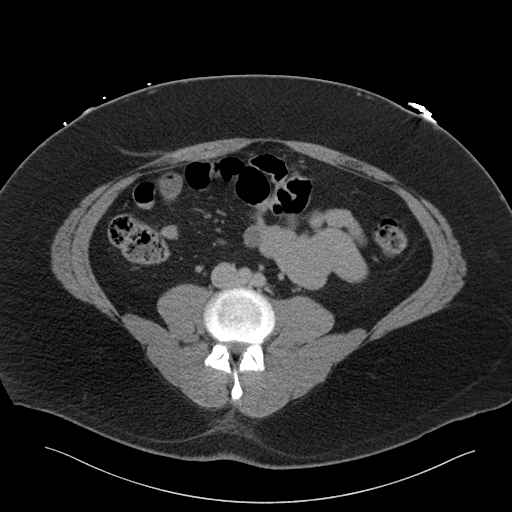
[im 63/114  soft-tissue]
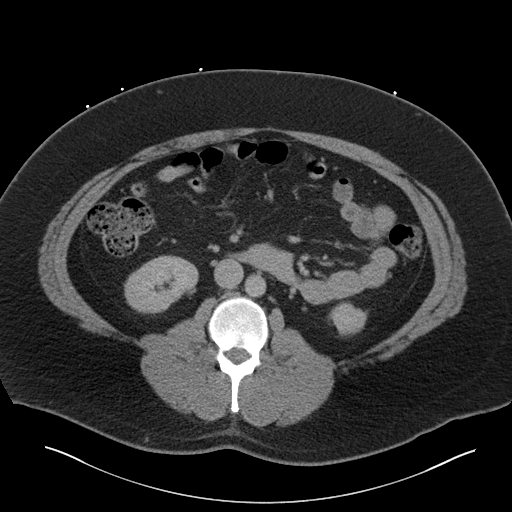
[im 70/114  soft-tissue]
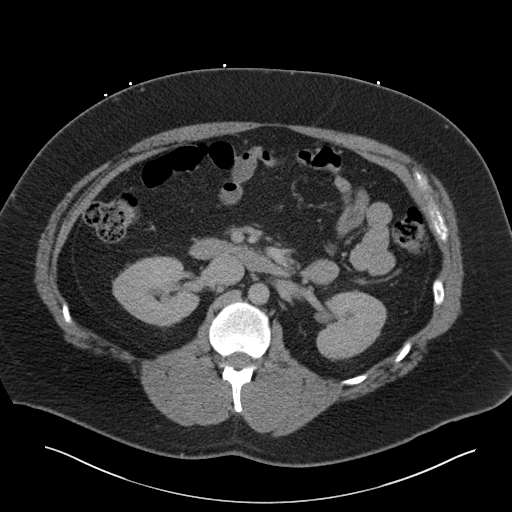
[im 70/114  bone]
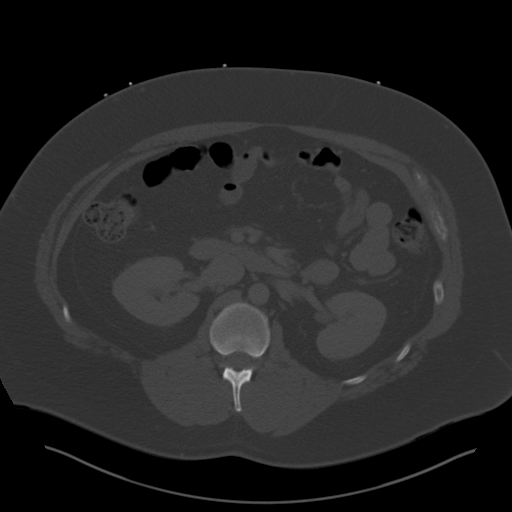
[im 76/114  soft-tissue]
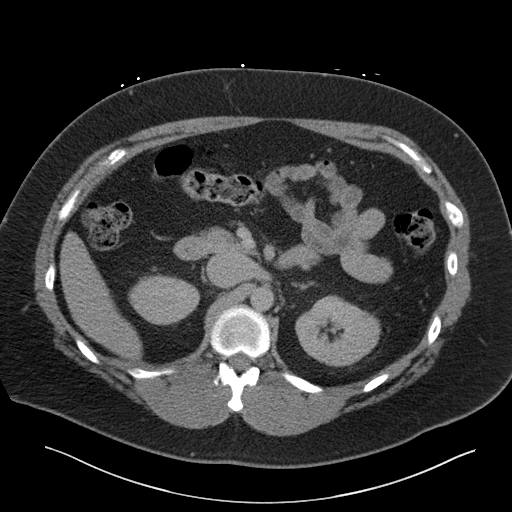
[im 82/114  soft-tissue]
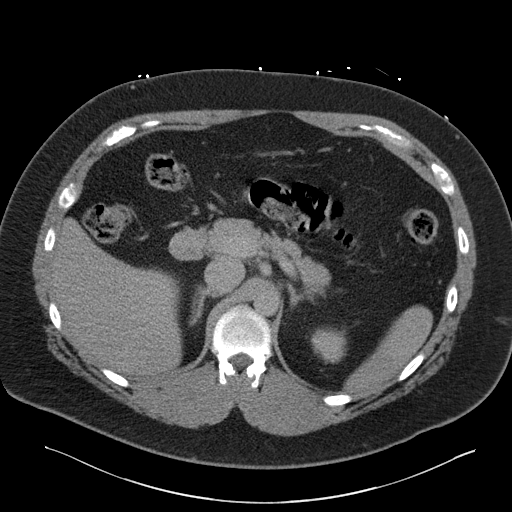
[im 88/114  soft-tissue]
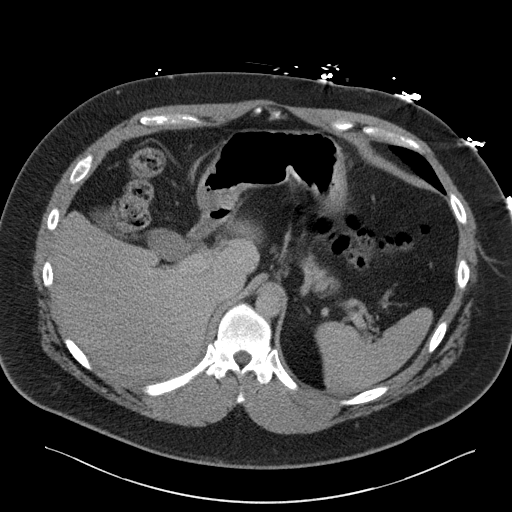
[im 101/114  soft-tissue]
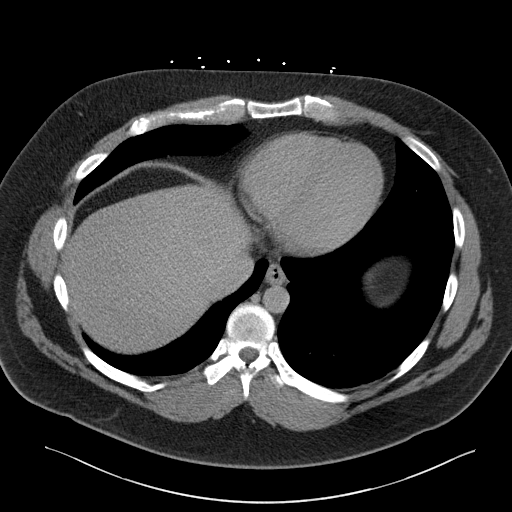
[im 107/114  soft-tissue]
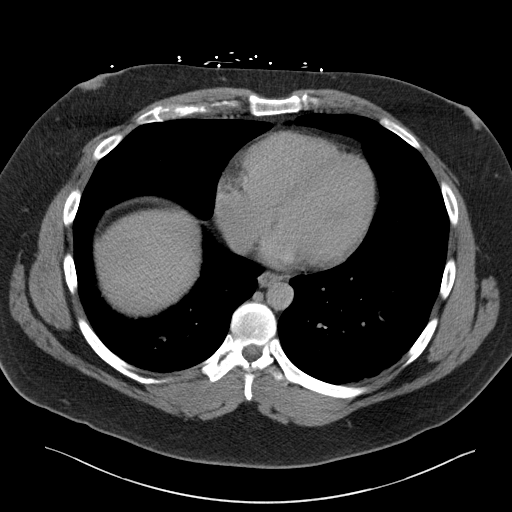

[Series 4: coronal st · coronal · 0.96mm/px · 3 of 147 slices shown]
[im 49/147  soft-tissue]
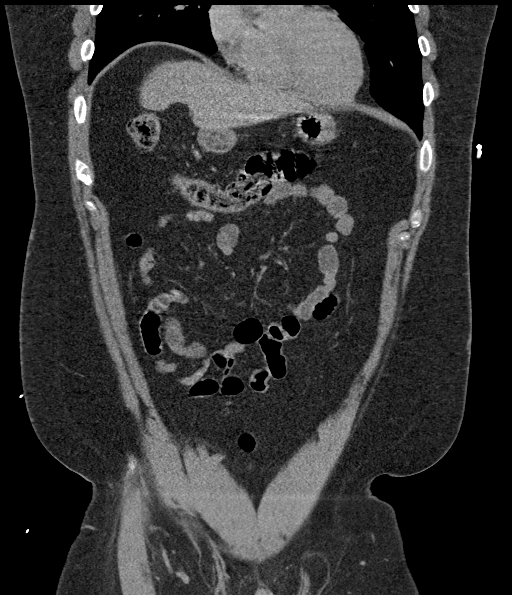
[im 65/147  soft-tissue]
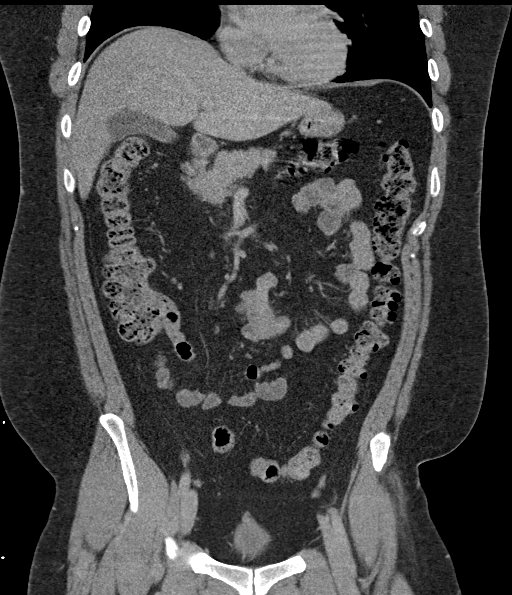
[im 82/147  soft-tissue]
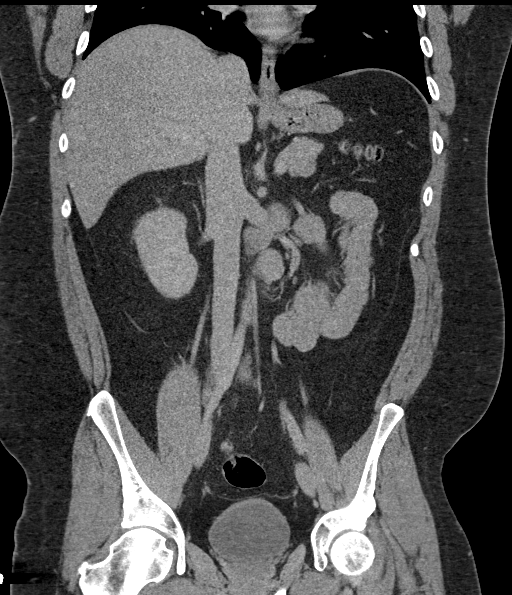

[17 of 46 positions shown; findings below may reference images not displayed]

FINDINGS: Lower chest:  No contributory findings.

Hepatobiliary: No focal liver abnormality.No evidence of biliary
obstruction or stone.

Pancreas: Unremarkable.

Spleen: Unremarkable.

Adrenals/Urinary Tract: Negative adrenals. No hydronephrosis or
stone. Unremarkable bladder.

Stomach/Bowel:  No obstruction. No appendicitis.

Vascular/Lymphatic: No acute vascular abnormality. No mass or
adenopathy.

Reproductive:No pathologic findings.

Other: No ascites or pneumoperitoneum.

Musculoskeletal: No acute abnormalities.  Mild hip spurring.
IMPRESSION: Negative abdominal CT. No rectal impaction or excessive stool
retention.

## 2021-07-22 IMAGING — CT CT HEAD W/O CM
3 series · 15 of 47 positions shown, 18 images · non-contrast
Comparison: None.

CLINICAL DATA: Headache.

EXAM:
CT HEAD WITHOUT CONTRAST
TECHNIQUE: Contiguous axial images were obtained from the base of the skull
through the vertex without intravenous contrast.

[Series 2: head wo · axial · 0.47mm/px · z∈[+1653,+1778]mm · 9 of 30 slices shown, 12 images]
[im 3/30  brain]
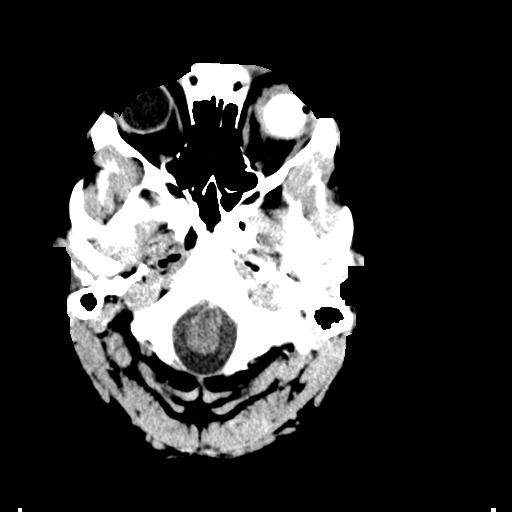
[im 3/30  bone]
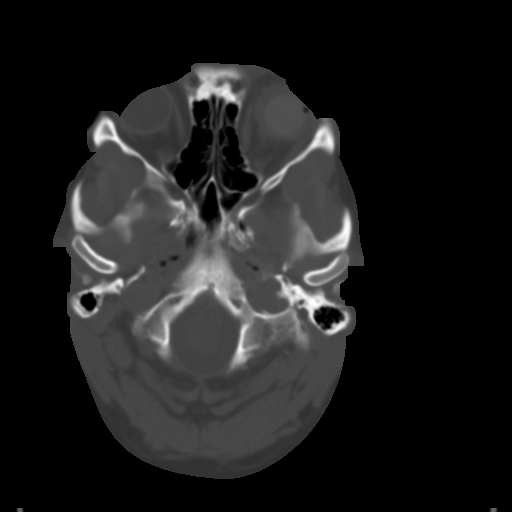
[im 6/30  brain]
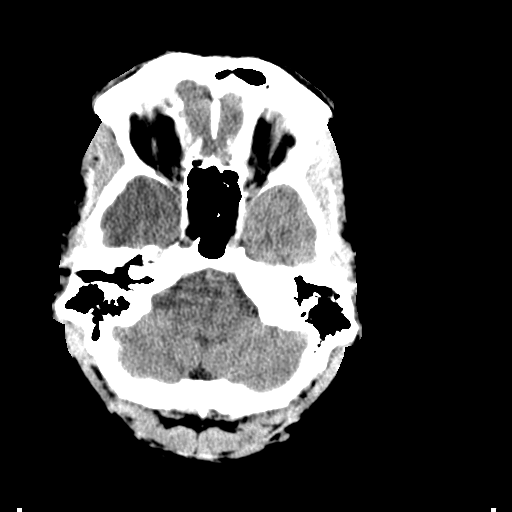
[im 9/30  brain]
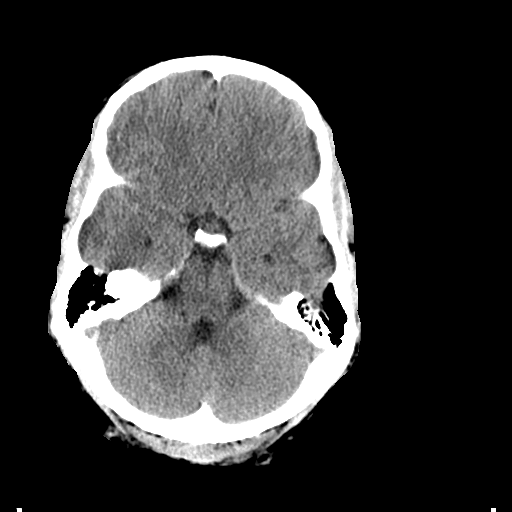
[im 12/30  brain]
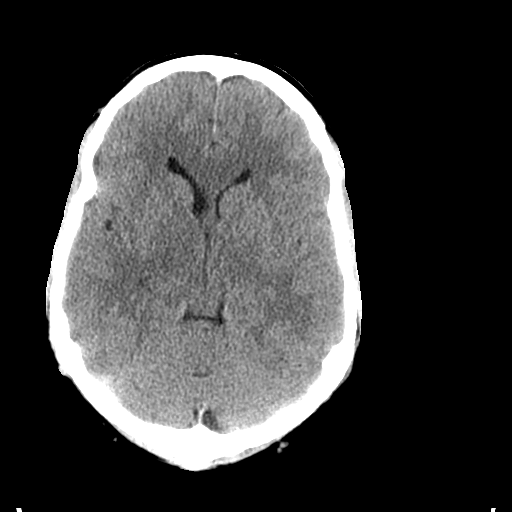
[im 16/30  brain]
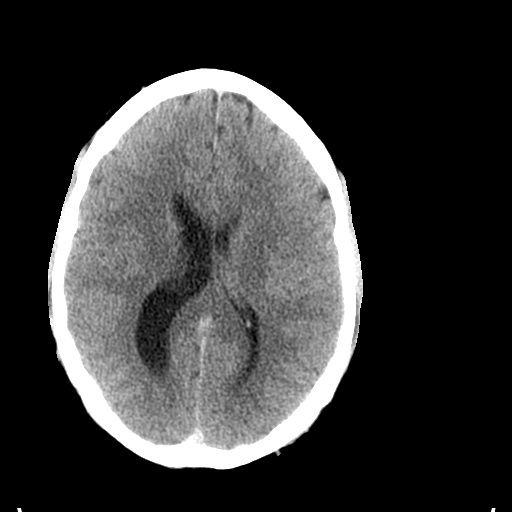
[im 16/30  bone]
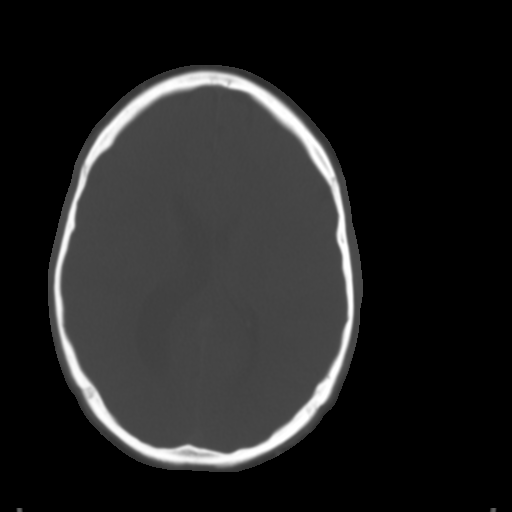
[im 19/30  brain]
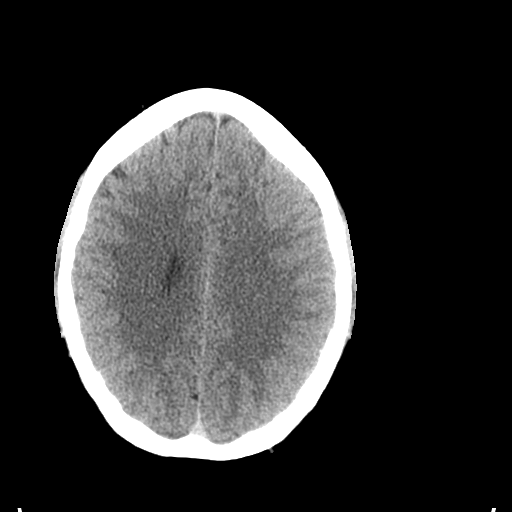
[im 22/30  brain]
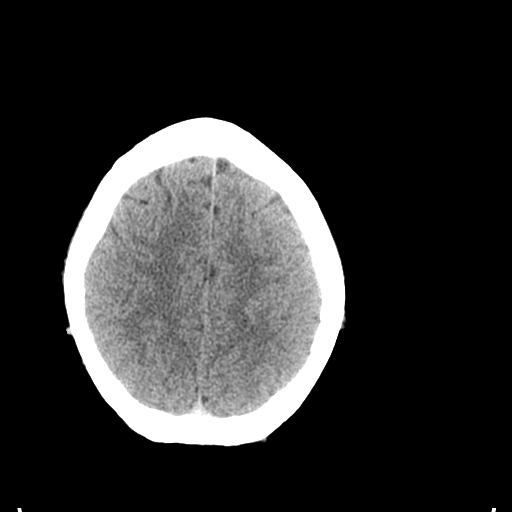
[im 25/30  brain]
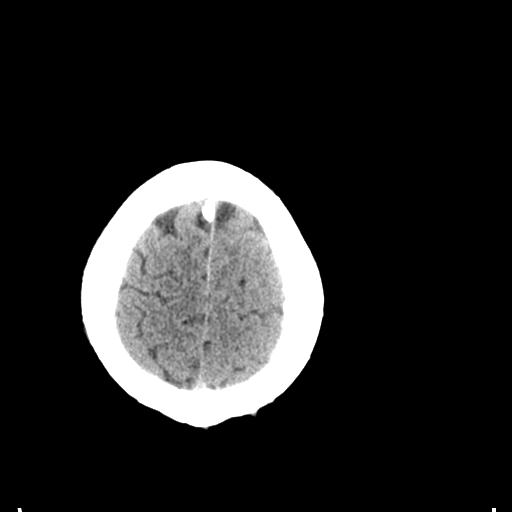
[im 28/30  brain]
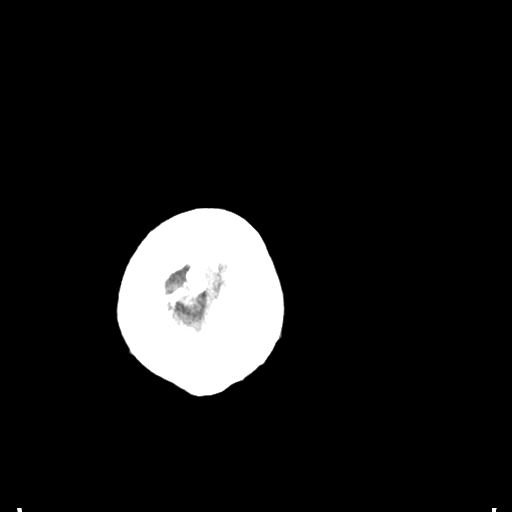
[im 28/30  bone]
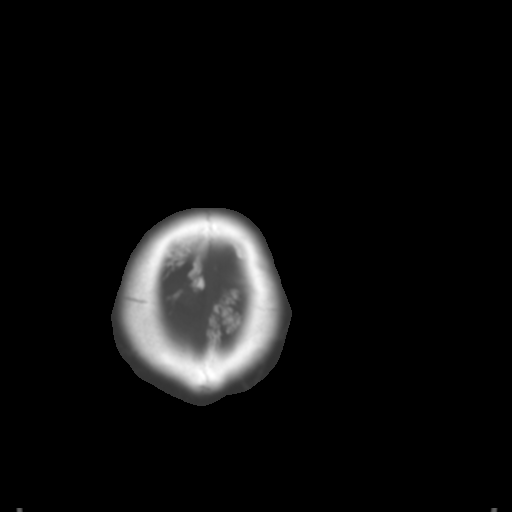

[Series 4: coronal soft tissue · coronal · 0.29mm/px · 3 of 66 slices shown]
[im 22/66  brain]
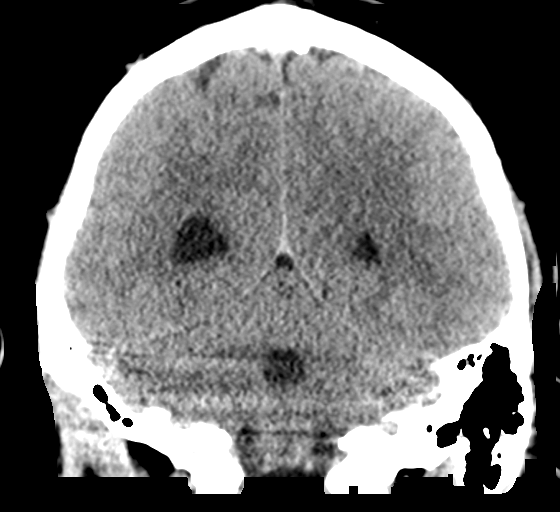
[im 29/66  brain]
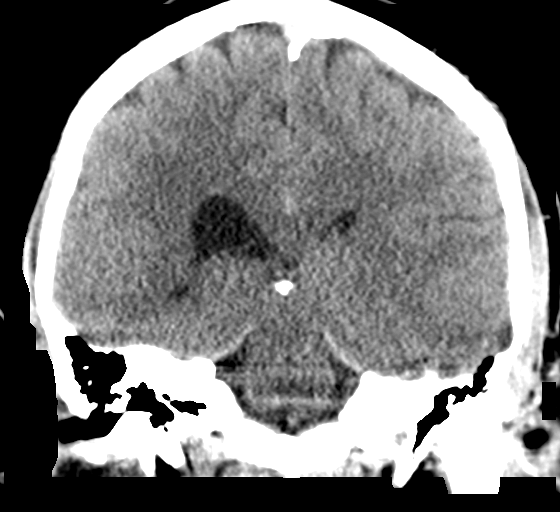
[im 37/66  brain]
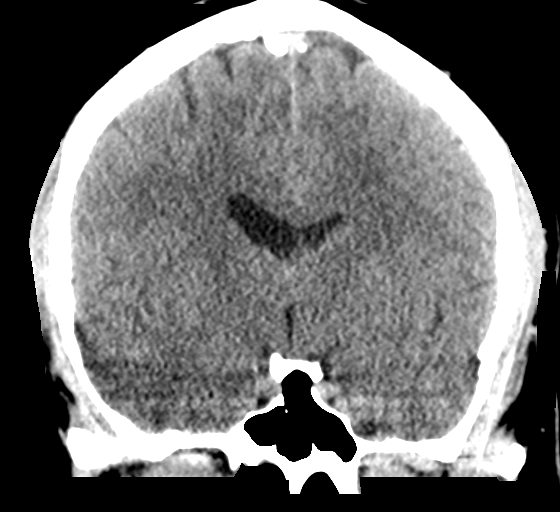

[Series 5: sagittal soft tissue · sagittal · 0.29mm/px · 3 of 50 slices shown]
[im 17/50  brain]
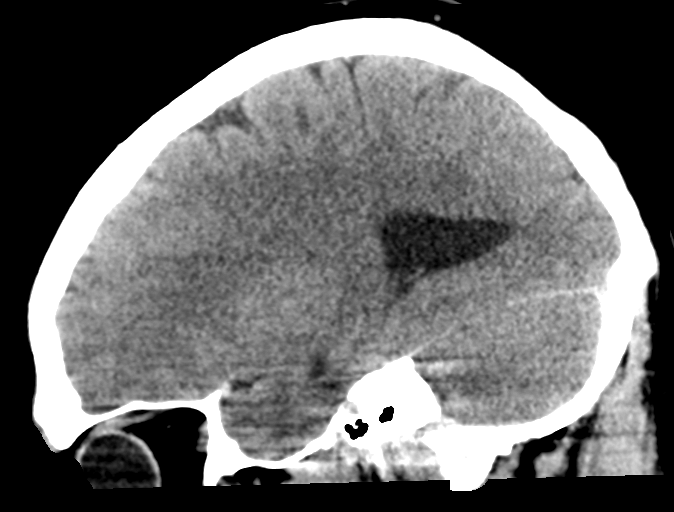
[im 25/50  brain]
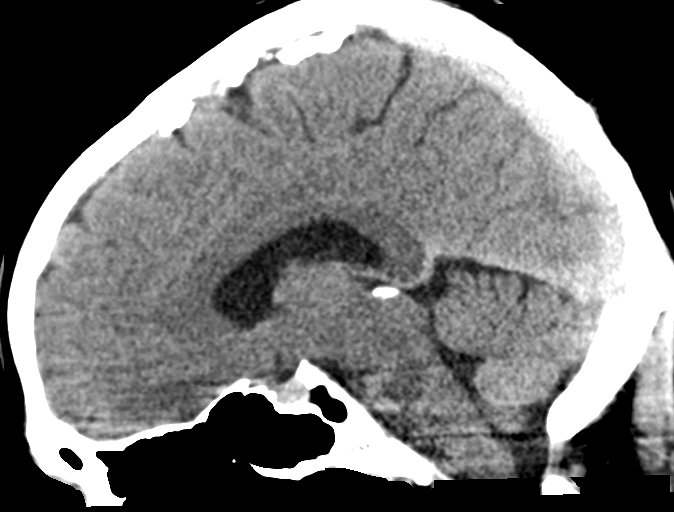
[im 33/50  brain]
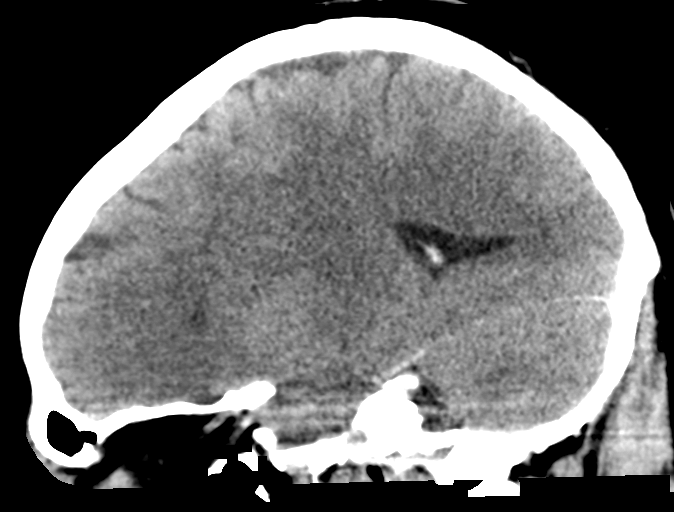

[15 of 47 positions shown; findings below may reference images not displayed]

FINDINGS: Brain: No evidence of acute infarction, hemorrhage, hydrocephalus,
extra-axial collection or mass lesion/mass effect. Asymmetric
lateral ventricular size, larger on the right, developmental
appearing with no midline shift or encephalomalacia.

Vascular: No hyperdense vessel or unexpected calcification.

Skull: Normal. Negative for fracture or focal lesion.

Sinuses/Orbits: Left enucleation and prosthesis implant with orbital
gas and swelling correlating with recent surgery.
IMPRESSION: No acute finding.

Recent left enucleation and prosthesis.

## 2021-08-17 DIAGNOSIS — Z4420 Encounter for fitting and adjustment of artificial eye, unspecified: Secondary | ICD-10-CM | POA: Diagnosis not present

## 2021-08-22 ENCOUNTER — Emergency Department (HOSPITAL_COMMUNITY)
Admission: EM | Admit: 2021-08-22 | Discharge: 2021-08-22 | Disposition: A | Discharge status: Home / Self Care | Payer: Medicare Other | Attending: Emergency Medicine | Admitting: Emergency Medicine

## 2021-08-22 ENCOUNTER — Encounter (HOSPITAL_COMMUNITY): Payer: Self-pay

## 2021-08-22 ENCOUNTER — Emergency Department (HOSPITAL_COMMUNITY): Payer: Medicare Other

## 2021-08-22 DIAGNOSIS — R0602 Shortness of breath: Secondary | ICD-10-CM | POA: Diagnosis not present

## 2021-08-22 DIAGNOSIS — R072 Precordial pain: Secondary | ICD-10-CM | POA: Insufficient documentation

## 2021-08-22 DIAGNOSIS — R079 Chest pain, unspecified: Secondary | ICD-10-CM

## 2021-08-22 DIAGNOSIS — Z20822 Contact with and (suspected) exposure to covid-19: Secondary | ICD-10-CM | POA: Diagnosis not present

## 2021-08-22 DIAGNOSIS — Z7984 Long term (current) use of oral hypoglycemic drugs: Secondary | ICD-10-CM | POA: Diagnosis not present

## 2021-08-22 DIAGNOSIS — Z79899 Other long term (current) drug therapy: Secondary | ICD-10-CM | POA: Insufficient documentation

## 2021-08-22 DIAGNOSIS — R0789 Other chest pain: Secondary | ICD-10-CM | POA: Diagnosis not present

## 2021-08-22 LAB — CBC
HCT: 46.7 % (ref 39.0–52.0)
Hemoglobin: 16 g/dL (ref 13.0–17.0)
MCH: 29.3 pg (ref 26.0–34.0)
MCHC: 34.3 g/dL (ref 30.0–36.0)
MCV: 85.5 fL (ref 80.0–100.0)
Platelets: 277 10*3/uL (ref 150–400)
RBC: 5.46 MIL/uL (ref 4.22–5.81)
RDW: 13.2 % (ref 11.5–15.5)
WBC: 8.1 10*3/uL (ref 4.0–10.5)
nRBC: 0 % (ref 0.0–0.2)

## 2021-08-22 LAB — D-DIMER, QUANTITATIVE: D-Dimer, Quant: <0.27 ug/mL-FEU (ref 0.00–0.50)

## 2021-08-22 LAB — BASIC METABOLIC PANEL
Anion gap: 6 (ref 5–15)
BUN: 13 mg/dL (ref 6–20)
CO2: 25 mmol/L (ref 22–32)
Calcium: 8.8 mg/dL — ABNORMAL LOW (ref 8.9–10.3)
Chloride: 108 mmol/L (ref 98–111)
Creatinine, Ser: 0.95 mg/dL (ref 0.61–1.24)
GFR, Estimated: >60 mL/min (ref >60)
Glucose, Bld: 100 mg/dL — ABNORMAL HIGH (ref 70–99)
Potassium: 3.9 mmol/L (ref 3.5–5.1)
Sodium: 139 mmol/L (ref 135–145)

## 2021-08-22 LAB — TROPONIN I (HIGH SENSITIVITY)
Troponin I (High Sensitivity): 3 ng/L (ref <18)
Troponin I (High Sensitivity): 2 ng/L (ref <18)

## 2021-08-22 LAB — RESP PANEL BY RT-PCR (FLU A&B, COVID) ARPGX2
Influenza A by PCR: NEGATIVE
Influenza B by PCR: NEGATIVE
SARS Coronavirus 2 by RT PCR: NEGATIVE

## 2021-08-22 NOTE — ED Triage Notes (Signed)
Pt arrived via POV, c/o CP/SOB since last night. Woke from sleep. Worsening with mvmt and deep breathing.

## 2021-08-22 NOTE — Discharge Instructions (Signed)
Your workup was overall reassuring in the ED today.  I would recommend Ibuprofen and Tylenol as needed for pain.   Follow up with your PCP for further evaluation of your pain  Return to the ED for any new/worsening symptoms

## 2021-08-22 NOTE — ED Provider Triage Note (Signed)
Emergency Medicine Provider Triage Evaluation Note  Caleb Hoover , a 36 y.o. male  was evaluated in triage.  Pt complains of chest pain.  Pain started this morning approximately 7:53 AM.  Pain has been constant since then.  Pain is located to inferior aspect of his sternum.  Pain does not radiate.  Pain is worse with movement and deep inhalation.  Patient endorses associated shortness of breath  Review of Systems  Positive: Chest pain, shortness of breath Negative: Hemoptysis, leg swelling or tenderness, palpitations, nausea, vomiting, diaphoresis  Physical Exam  BP 132/86 (BP Location: Right Arm)    Pulse 80    Temp 98.5 F (36.9 C) (Oral)    Resp 18    Ht 7' (2.134 m)    Wt (!) 140.6 kg    SpO2 99%    BMI 30.89 kg/m  Gen:   Awake, no distress   Resp:  Normal effort, lungs clear to auscultation bilaterally MSK:   Moves extremities without difficulty, no swelling or tenderness to bilateral lower extremities Other:    Medical Decision Making  Medically screening exam initiated at 11:25 AM.  Appropriate orders placed.  Caleb Hoover was informed that the remainder of the evaluation will be completed by another provider, this initial triage assessment does not replace that evaluation, and the importance of remaining in the ED until their evaluation is complete.     Caleb Hoover, [place] 08/22/21 1126

## 2021-08-22 NOTE — ED Provider Notes (Signed)
[facility] [facility] DEPT Provider Note   CSN: [id number] Arrival date & time: 08/22/21  1037     History  Chief Complaint  Patient presents with   Chest Pain    Caleb Hoover is a 36 y.o. male who presents to the ED today with complaint of gradual onset, constant, pressure-like, substernal chest pain that began this morning around 7 AM upon waking up.  Patient reports that he was playing video games with his brother last night and wanted to go to sleep because he was very fatigued.  When he woke up this morning and was having chest pain he states it felt very similar to when he had [place] 2 years ago.  Patient denies any fevers, chills, cough, body aches.  He does report he feels mildly short of breath.  The chest pain is worse with deep inspiration however is also worse with movement.  He denies any history of DVT or PE.  He denies any recent prolonged travel or immobilization.  No hemoptysis.  No active malignancy.  No exogenous hormone use.  Patient is a never smoker.  The history is provided by the patient and medical records.      Home Medications Prior to Admission medications   Medication Sig Start Date End Date Taking? Authorizing Provider  atorvastatin (LIPITOR) 10 MG tablet Take 1 tablet (10 mg total) by mouth daily. 12/28/20   Nafziger, Tommi Rumps, NP  metFORMIN (GLUCOPHAGE) 500 MG tablet Take 1 tablet (500 mg total) by mouth 2 (two) times daily with a meal. 12/28/20   Nafziger, Tommi Rumps, NP  Turmeric (QC TUMERIC COMPLEX PO) Take by mouth.    [provider]      Allergies    Ceclor [cefaclor] and Oxycodone    Review of Systems   Review of Systems  Constitutional:  Negative for chills and fever.  Respiratory:  Positive for shortness of breath. Negative for cough.   Cardiovascular:  Positive for chest pain.  Gastrointestinal:  Negative for nausea and vomiting.  Musculoskeletal:  Negative for myalgias.  All other systems reviewed and are  negative.  Physical Exam Updated Vital Signs BP (!) 174/84    Pulse 65    Temp 98.5 F (36.9 C) (Oral)    Resp 16    Ht 7' (2.134 m)    Wt (!) 140.6 kg    SpO2 96%    BMI 30.89 kg/m  Physical Exam Vitals and nursing note reviewed.  Constitutional:      Appearance: He is not ill-appearing or diaphoretic.  HENT:     Head: Normocephalic and atraumatic.  Eyes:     Conjunctiva/sclera: Conjunctivae normal.     Comments: False left eye  Cardiovascular:     Rate and Rhythm: Normal rate and regular rhythm.  Pulmonary:     Effort: Pulmonary effort is normal.     Breath sounds: Normal breath sounds. No decreased breath sounds, wheezing, rhonchi or rales.  Abdominal:     Palpations: Abdomen is soft.     Tenderness: There is no abdominal tenderness. There is no guarding or rebound.  Musculoskeletal:     Cervical back: Neck supple.     Right lower leg: No edema.     Left lower leg: No edema.  Skin:    General: Skin is warm and dry.  Neurological:     Mental Status: He is alert.    ED Results / Procedures / Treatments   Labs (all labs ordered are listed, but  only abnormal results are displayed) Labs Reviewed  BASIC METABOLIC PANEL - Abnormal; Notable for the following components:      Result Value   Glucose, Bld 100 (*)    Calcium 8.8 (*)    All other components within normal limits  RESP PANEL BY RT-PCR (FLU A&B, COVID) ARPGX2  CBC  D-DIMER, QUANTITATIVE  TROPONIN I (HIGH SENSITIVITY)  TROPONIN I (HIGH SENSITIVITY)    EKG None  Radiology DG Chest 2 View  Result Date: 08/22/2021 CLINICAL DATA:  Sternal chest pain and shortness of breath since 7:50 a.m. today. EXAM: CHEST - 2 VIEW COMPARISON:  02/05/2020 FINDINGS: Stable mild peribronchial thickening. Normal sized heart. Clear lungs with normal vascularity. Unremarkable bones. IMPRESSION: Stable mild chronic bronchitic changes.  No acute abnormality. Electronically Signed   By: Claudie Revering M.D.   On: 08/22/2021 12:04     Procedures Procedures    Medications Ordered in ED Medications - No data to display  ED Course/ Medical Decision Making/ A&P                           Medical Decision Making 36 year old male who presents to the ED today with complaint of substernal chest pain, worse with deep inspiration that began this morning with associated shortness of breath.  Reports similar to previous COVID infection 2 years ago.  On arrival to the ED vitals are stable.  Patient is afebrile, nontachycardic and nontachypneic and appears to be in no acute distress.  He had an EKG and chest x-ray done prior to being seen which has returned negative at this time.  His initial troponin is 3 however given his chest pain started today we will plan for repeat troponin testing.  Given the pain is worse with deep inspiration we will add on D-dimer at this time however he has low Wells risk and if negative I do not feel he requires CTA at this time.  His COVID and flu test is currently pending as well.  We will continue to monitor  Problems Addressed: Nonspecific chest pain: acute illness or injury    Details: Workup overall reassuring in the ED today. Troponin negative x 2. EKG without acute ischemic changes. D dimer < 0.27. Low well's risk. COVID and flu negative. Pt to be discharged home at this time with PCP follow up. Have recommended Ibuprofen and Tylenol as needed for pain. Pt in agreement with plan and stable for discharge home.  Amount and/or Complexity of Data Reviewed Labs: ordered.    Details: CBC without leukocytosis. Hgb stable at 16.0 BMP with glucose 100. Calcium 8.8. No other electrolyte abnormalities.  Troponin of 3. Will plan to repeat given pain started today.   COVID and flu negative  D dimer < 0.27. Low well's risk.  Repeat troponin of 2 (downtrending) Radiology: ordered.    Details: CXR clear ECG/medicine tests: ordered.    Details: EKG without acute ischemic changes          Final  Clinical Impression(s) / ED Diagnoses Final diagnoses:  Nonspecific chest pain    Rx / DC Orders ED Discharge Orders     None        Discharge Instructions      Your workup was overall reassuring in the ED today.  I would recommend Ibuprofen and Tylenol as needed for pain.   Follow up with your PCP for further evaluation of your pain  Return to the ED for any  new/worsening symptoms        Eustaquio Maize, PA-C 08/22/21 [place], DO 08/22/21 1553

## 2021-12-20 ENCOUNTER — Ambulatory Visit: Payer: Medicare Other

## 2022-01-03 ENCOUNTER — Ambulatory Visit: Payer: Medicare Other

## 2022-01-03 DIAGNOSIS — J020 Streptococcal pharyngitis: Secondary | ICD-10-CM | POA: Diagnosis not present

## 2022-01-03 DIAGNOSIS — J029 Acute pharyngitis, unspecified: Secondary | ICD-10-CM | POA: Diagnosis not present

## 2022-01-09 ENCOUNTER — Ambulatory Visit (INDEPENDENT_AMBULATORY_CARE_PROVIDER_SITE_OTHER): Payer: Medicare Other

## 2022-01-09 VITALS — Ht >= 80 in | Wt 310.0 lb

## 2022-01-09 DIAGNOSIS — Z0000 Encounter for general adult medical examination without abnormal findings: Secondary | ICD-10-CM | POA: Diagnosis not present

## 2022-01-09 NOTE — Progress Notes (Signed)
Subjective:   Caleb Hoover is a 36 y.o. male who presents for Medicare Annual/Subsequent preventive examination.  Review of Systems    Virtual Visit via Telephone Note  I connected with  Caleb Hoover on 01/09/22 at  2:30 PM EDT by telephone and verified that I am speaking with the correct person using two identifiers.  Location: Patient: Home Provider: Office Persons participating in the virtual visit: patient/Nurse Health Advisor   I discussed the limitations, risks, security and privacy concerns of performing an evaluation and management service by telephone and the availability of in person appointments. The patient expressed understanding and agreed to proceed.  Interactive audio and video telecommunications were attempted between this nurse and patient, however failed, due to patient having technical difficulties OR patient did not have access to video capability.  We continued and completed visit with audio only.  Some vital signs may be absent or patient reported.   Criselda Peaches, LPN  Cardiac Risk Factors include: advanced age (>6mn, >>27women)     Objective:    Today's Vitals   01/09/22 1429  Weight: (!) 310 lb (140.6 kg)  Height: 7' (2.134 m)   Body mass index is 30.89 kg/m.     01/09/2022    2:40 PM 12/14/2020    1:48 PM 02/06/2020   12:14 AM 02/05/2020    1:14 PM 04/30/2016    1:00 AM  Advanced Directives  Does Patient Have a Medical Advance Directive? No No No No No  Would patient like information on creating a medical advance directive? No - Patient declined No - Patient declined No - Patient declined  No - patient declined information    Current Medications (verified) Outpatient Encounter Medications as of 01/09/2022  Medication Sig   atorvastatin (LIPITOR) 10 MG tablet Take 1 tablet (10 mg total) by mouth daily.   metFORMIN (GLUCOPHAGE) 500 MG tablet Take 1 tablet (500 mg total) by mouth 2 (two) times daily with a meal.   Turmeric  (QC TUMERIC COMPLEX PO) Take by mouth.   No facility-administered encounter medications on file as of 01/09/2022.    Allergies (verified) Ceclor [cefaclor] and Oxycodone   History: Past Medical History:  Diagnosis Date   Anxiety    Blindness of left eye    hit with paintball at age 36  Chicken pox    Depression    Frequent headaches    Glaucoma    Mental retardation    Has trouble reading    Past Surgical History:  Procedure Laterality Date   left eye surgery      Family History  Family history unknown: Yes   Social History   Socioeconomic History   Marital status: Single    Spouse name: Not on file   Number of children: Not on file   Years of education: Not on file   Highest education level: Not on file  Occupational History   Not on file  Tobacco Use   Smoking status: Never   Smokeless tobacco: Never  Vaping Use   Vaping Use: Never used  Substance and Sexual Activity   Alcohol use: Yes   Drug use: No   Sexual activity: Yes  Other Topics Concern   Not on file  Social History Narrative   Not on file   Social Determinants of Health   Financial Resource Strain: Low Risk  (01/09/2022)   Overall Financial Resource Strain (CARDIA)    Difficulty of Paying Living Expenses: Not hard  at all  Food Insecurity: No Food Insecurity (01/09/2022)   Hunger Vital Sign    Worried About Running Out of Food in the Last Year: Never true    Ran Out of Food in the Last Year: Never true  Transportation Needs: No Transportation Needs (01/09/2022)   PRAPARE - Hydrologist (Medical): No    Lack of Transportation (Non-Medical): No  Physical Activity: Sufficiently Active (01/09/2022)   Exercise Vital Sign    Days of Exercise per Week: 5 days    Minutes of Exercise per Session: 30 min  Stress: No Stress Concern Present (01/09/2022)   [place]    Feeling of Stress : Not at all  Social  Connections: Socially Isolated (01/09/2022)   Social Connection and Isolation Panel [NHANES]    Frequency of Communication with Friends and Family: More than three times a week    Frequency of Social Gatherings with Friends and Family: More than three times a week    Attends Religious Services: Never    Marine scientist or Organizations: No    Attends Archivist Meetings: Never    Marital Status: Never married    Tobacco Counseling Counseling given: Not Answered   Clinical Intake: How often do you need to have someone help you when you read instructions, pamphlets, or other written materials from your doctor or pharmacy?: 3 - Sometimes (Mother Assist)  Diabetic?  No  Activities of Daily Living    01/09/2022    2:38 PM  In your present state of health, do you have any difficulty performing the following activities:  Hearing? 0  Vision? 0  Difficulty concentrating or making decisions? 0  Walking or climbing stairs? 0  Dressing or bathing? 0  Doing errands, shopping? 0  Preparing Food and eating ? N  Using the Toilet? N  In the past six months, have you accidently leaked urine? N  Do you have problems with loss of bowel control? N  Managing your Medications? N  Managing your Finances? N  Housekeeping or managing your Housekeeping? N    Patient Care Team: Dorothyann Peng, NP as PCP - General (Family Medicine)  Indicate any recent Medical Services you may have received from other than [facility] providers in the past year (date may be approximate).     Assessment:   This is a routine wellness examination for Caleb Hoover.  Hearing/Vision screen Hearing Screening - Comments:: No hearing difficulty Vision Screening - Comments:: Wears glasses. Followed by Dr Sherral Hammers  Dietary issues and exercise activities discussed: Exercise limited by: None identified   Goals Addressed               This Visit's Progress     Patient Stated (pt-stated)        None at this  time.       Depression Screen    01/09/2022    2:36 PM 12/14/2020    1:46 PM 09/06/2020    2:05 PM  PHQ 2/9 Scores  PHQ - 2 Score 0 0 0    Fall Risk    01/09/2022    2:39 PM 12/14/2020    1:50 PM  [place] in the past year? 0 0  Number falls in past yr: 0 0  Injury with Fall? 0 0  Risk for fall due to : No Fall Risks Impaired vision  Follow up  Falls prevention discussed  FALL RISK PREVENTION PERTAINING TO THE HOME:  Any stairs in or around the home? Yes  e any without handrails? No  Home free of loose throw rugs in walkways, pet beds, electrical cords, etc? Yes  Adequate lighting in your home to reduce risk of falls? Yes   ASSISTIVE DEVICES UTILIZED TO PREVENT FALLS:  Life alert? No  Use of a cane, walker or w/c? No  Grab bars in the bathroom? No  Shower chair or bench in shower? No  Elevated toilet seat or a handicapped toilet? No   TIMED UP AND GO:  Was the test performed? No . Audio Visit  Cognitive Function:       01/09/2022    2:40 PM  6CIT Screen  What Year? 0 points  What month? 0 points  What time? 0 points  Count back from 20 0 points  Months in reverse 2 points  Repeat phrase 0 points  Total Score 2 points    Immunizations Immunization History  Administered Date(s) Administered   PFIZER Comirnaty(Gray Top)Covid-19 Tri-Sucrose Vaccine 11/12/2019, 12/10/2019, 06/05/2020   Tdap 02/09/2010    TDAP status: Due, Education has been provided regarding the importance of this vaccine. Advised may receive this vaccine at local pharmacy or Health Dept. Aware to provide a copy of the vaccination record if obtained from local pharmacy or Health Dept. Verbalized acceptance and understanding.  Covid-19 vaccine status: Completed vaccines  Qualifies for Shingles Vaccine? No   Zostavax completed No   Shingrix Completed?: No.    Education has been provided regarding the importance of this vaccine. Patient has been advised to call insurance company  to determine out of pocket expense if they have not yet received this vaccine. Advised may also receive vaccine at local pharmacy or Health Dept. Verbalized acceptance and understanding.  Screening Tests Health Maintenance  Topic Date Due   TETANUS/TDAP  01/10/2023 (Originally 02/10/2020)   INFLUENZA VACCINE  02/28/2022   Hepatitis C Screening  Completed   HIV Screening  Completed   HPV VACCINES  Aged Out   COVID-19 Vaccine  Discontinued    Health Maintenance  There are no preventive care reminders to display for this patient.     Lung Cancer Screening: (Low Dose CT Chest recommended if Age 70-80 years, 30 pack-year currently smoking OR have quit w/in 15years.) does qualify.     Additional Screening:  Hepatitis C Screening: does qualify; Completed 12/24/20  Vision Screening: Recommended annual ophthalmology exams for early detection of glaucoma and other disorders of the eye. Is the patient up to date with their annual eye exam?  Yes  Who is the provider or what is the name of the office in which the patient attends annual eye exams? Dr Sherral Hammers If pt is not established with a provider, would they like to be referred to a provider to establish care? No .   Dental Screening: Recommended annual dental exams for proper oral hygiene  Community Resource Referral / Chronic Care Management:  CRR required this visit?  No   CCM required this visit?  No      Plan:     I have personally reviewed and noted the following in the patient's chart:   Medical and social history Use of alcohol, tobacco or illicit drugs  Current medications and supplements including opioid prescriptions. Patient is not currently taking opioid prescriptions. Functional ability and status Nutritional status Physical activity Advanced directives List of other physicians Hospitalizations, surgeries, and ER visits in previous 12 months  Vitals Screenings to include cognitive, depression, and  falls Referrals and appointments  In addition, I have reviewed and discussed with patient certain preventive protocols, quality metrics, and best practice recommendations. A written personalized care plan for preventive services as well as general preventive health recommendations were provided to patient.     Criselda Peaches, LPN   01/14/736   Nurse Notes: None

## 2022-01-09 NOTE — Patient Instructions (Addendum)
Caleb Hoover , Thank you for taking time to come for your Medicare Wellness Visit. I appreciate your ongoing commitment to your health goals. Please review the following plan we discussed and let me know if I can assist you in the future.   These are the goals we discussed:  Goals       Patient Stated (pt-stated)      None at this time.        This is a list of the screening recommended for you and due dates:  Health Maintenance  Topic Date Due   Tetanus Vaccine  01/10/2023*   Flu Shot  02/28/2022   Hepatitis C Screening: USPSTF Recommendation to screen - Ages 18-79 yo.  Completed   HIV Screening  Completed   HPV Vaccine  Aged Out   COVID-19 Vaccine  Discontinued  *Topic was postponed. The date shown is not the original due date.   Advanced directives: No  Conditions/risks identified: None  Next appointment: Follow up in one year for your annual wellness visit   Preventive Care 40-64 Years, Male Preventive care refers to lifestyle choices and visits with your health care provider that can promote health and wellness. What does preventive care include? A yearly physical exam. This is also called an annual well check. Dental exams once or twice a year. Routine eye exams. Ask your health care provider how often you should have your eyes checked. Personal lifestyle choices, including: Daily care of your teeth and gums. Regular physical activity. Eating a healthy diet. Avoiding tobacco and drug use. Limiting alcohol use. Practicing safe sex. Taking low-dose aspirin every day starting at age 86. What happens during an annual well check? The services and screenings done by your health care provider during your annual well check will depend on your age, overall health, lifestyle risk factors, and family history of disease. Counseling  Your health care provider may ask you questions about your: Alcohol use. Tobacco use. Drug use. Emotional well-being. Home and relationship  well-being. Sexual activity. Eating habits. Work and work Statistician. Screening  You may have the following tests or measurements: Height, weight, and BMI. Blood pressure. Lipid and cholesterol levels. These may be checked every 5 years, or more frequently if you are over 62 years old. Skin check. Lung cancer screening. You may have this screening every year starting at age 37 if you have a 30-pack-year history of smoking and currently smoke or have quit within the past 15 years. Fecal occult blood test (FOBT) of the stool. You may have this test every year starting at age 2. Flexible sigmoidoscopy or colonoscopy. You may have a sigmoidoscopy every 5 years or a colonoscopy every 10 years starting at age 76. Prostate cancer screening. Recommendations will vary depending on your family history and other risks. Hepatitis C blood test. Hepatitis B blood test. Sexually transmitted disease (STD) testing. Diabetes screening. This is done by checking your blood sugar (glucose) after you have not eaten for a while (fasting). You may have this done every 1-3 years. Discuss your test results, treatment options, and if necessary, the need for more tests with your health care provider. Vaccines  Your health care provider may recommend certain vaccines, such as: Influenza vaccine. This is recommended every year. Tetanus, diphtheria, and acellular pertussis (Tdap, Td) vaccine. You may need a Td booster every 10 years. Zoster vaccine. You may need this after age 36. Pneumococcal 13-valent conjugate (PCV13) vaccine. You may need this if you have certain conditions and  have not been vaccinated. Pneumococcal polysaccharide (PPSV23) vaccine. You may need one or two doses if you smoke cigarettes or if you have certain conditions. Talk to your health care provider about which screenings and vaccines you need and how often you need them. This information is not intended to replace advice given to you by your  health care provider. Make sure you discuss any questions you have with your health care provider. Document Released: 08/13/2015 Document Revised: 04/05/2016 Document Reviewed: 05/18/2015 Elsevier Interactive Patient Education  2017 [place] Prevention in the Home Falls can cause injuries. They can happen to people of all ages. There are many things you can do to make your home safe and to help prevent falls. What can I do on the outside of my home? Regularly fix the edges of walkways and driveways and fix any cracks. Remove anything that might make you trip as you walk through a door, such as a raised step or threshold. Trim any bushes or trees on the path to your home. Use bright outdoor lighting. Clear any walking paths of anything that might make someone trip, such as rocks or tools. Regularly check to see if handrails are loose or broken. Make sure that both sides of any steps have handrails. Any raised decks and porches should have guardrails on the edges. Have any leaves, snow, or ice cleared regularly. Use sand or salt on walking paths during winter. Clean up any spills in your garage right away. This includes oil or grease spills. What can I do in the bathroom? Use night lights. Install grab bars by the toilet and in the tub and shower. Do not use towel bars as grab bars. Use non-skid mats or decals in the tub or shower. If you need to sit down in the shower, use a plastic, non-slip stool. Keep the floor dry. Clean up any water that spills on the floor as soon as it happens. Remove soap buildup in the tub or shower regularly. Attach bath mats securely with double-sided non-slip rug tape. Do not have throw rugs and other things on the floor that can make you trip. What can I do in the bedroom? Use night lights. Make sure that you have a light by your bed that is easy to reach. Do not use any sheets or blankets that are too big for your bed. They should not hang down  onto the floor. Have a firm chair that has side arms. You can use this for support while you get dressed. Do not have throw rugs and other things on the floor that can make you trip. What can I do in the kitchen? Clean up any spills right away. Avoid walking on wet floors. Keep items that you use a lot in easy-to-reach places. If you need to reach something above you, use a strong step stool that has a grab bar. Keep electrical cords out of the way. Do not use floor polish or wax that makes floors slippery. If you must use wax, use non-skid floor wax. Do not have throw rugs and other things on the floor that can make you trip. What can I do with my stairs? Do not leave any items on the stairs. Make sure that there are handrails on both sides of the stairs and use them. Fix handrails that are broken or loose. Make sure that handrails are as long as the stairways. Check any carpeting to make sure that it is firmly attached to the stairs.  Fix any carpet that is loose or worn. Avoid having throw rugs at the top or bottom of the stairs. If you do have throw rugs, attach them to the floor with carpet tape. Make sure that you have a light switch at the top of the stairs and the bottom of the stairs. If you do not have them, ask someone to add them for you. What else can I do to help prevent falls? Wear shoes that: Do not have high heels. Have rubber bottoms. Are comfortable and fit you well. Are closed at the toe. Do not wear sandals. If you use a stepladder: Make sure that it is fully opened. Do not climb a closed stepladder. Make sure that both sides of the stepladder are locked into place. Ask someone to hold it for you, if possible. Clearly mark and make sure that you can see: Any grab bars or handrails. First and last steps. Where the edge of each step is. Use tools that help you move around (mobility aids) if they are needed. These include: Canes. Walkers. Scooters. Crutches. Turn  on the lights when you go into a dark area. Replace any light bulbs as soon as they burn out. Set up your furniture so you have a clear path. Avoid moving your furniture around. If any of your floors are uneven, fix them. If there are any pets around you, be aware of where they are. Review your medicines with your doctor. Some medicines can make you feel dizzy. This can increase your chance of falling. Ask your doctor what other things that you can do to help prevent falls. This information is not intended to replace advice given to you by your health care provider. Make sure you discuss any questions you have with your health care provider. Document Released: 05/13/2009 Document Revised: 12/23/2015 Document Reviewed: 08/21/2014 Elsevier Interactive Patient Education  2017 [employer].

## 2022-03-16 DIAGNOSIS — Z4420 Encounter for fitting and adjustment of artificial eye, unspecified: Secondary | ICD-10-CM | POA: Diagnosis not present

## 2022-03-20 DIAGNOSIS — H6691 Otitis media, unspecified, right ear: Secondary | ICD-10-CM | POA: Diagnosis not present

## 2022-03-20 DIAGNOSIS — H6122 Impacted cerumen, left ear: Secondary | ICD-10-CM | POA: Diagnosis not present

## 2022-07-07 ENCOUNTER — Encounter: Payer: Self-pay | Admitting: *Deleted

## 2022-07-07 ENCOUNTER — Telehealth: Payer: Self-pay | Admitting: *Deleted

## 2022-07-07 NOTE — Patient Outreach (Signed)
  Care Coordination   Initial Visit Note   07/07/2022 Name: Domonick Sittner MRN: [id number] DOB: February 12, 1986  Lakin Romer is a 36 y.o. year old male who sees Nafziger, Tommi Rumps, NP for primary care. I spoke with  Glori Bickers by phone today.  What matters to the patients health and wellness today?  No needs    Goals Addressed               This Visit's Progress     COMPLETED: No needs (pt-stated)        Care Coordination Interventions: Reviewed medications with patient and discussed adherences with all her medications Reviewed scheduled/upcoming provider appointments including sufficient transportation Screening for signs and symptoms of depression related to chronic disease state  Assessed social determinant of health barriers         SDOH assessments and interventions completed:  Yes  SDOH Interventions Today    Flowsheet Row Most Recent Value  SDOH Interventions   Food Insecurity Interventions Intervention Not Indicated  Housing Interventions Intervention Not Indicated  Transportation Interventions Intervention Not Indicated  Utilities Interventions Intervention Not Indicated        Care Coordination Interventions:  Yes, provided   Follow up plan: No further intervention required.   Encounter Outcome:  Pt. Visit Completed   Raina Mina, RN Care Management Coordinator [place] Office [phone]

## 2022-07-07 NOTE — Patient Instructions (Signed)
Visit Information  Thank you for taking time to visit with me today. Please don't hesitate to contact me if I can be of assistance to you.   Following are the goals we discussed today:   Goals Addressed               This Visit's Progress     COMPLETED: No needs (pt-stated)        Care Coordination Interventions: Reviewed medications with patient and discussed adherences with all her medications Reviewed scheduled/upcoming provider appointments including sufficient transportation Screening for signs and symptoms of depression related to chronic disease state  Assessed social determinant of health barriers        Please call the care guide team at [phone] if you need to cancel or reschedule your appointment.   If you are experiencing a Mental Health or [place] or need someone to talk to, please call the Suicide and Crisis Lifeline: 988  The patient verbalized understanding of instructions, educational materials, and care plan provided today and DECLINED offer to receive copy of patient instructions, educational materials, and care plan.   No further follow up required: No needs   Raina Mina, RN Care Management Coordinator [place] Office [phone]

## 2022-09-12 DIAGNOSIS — Z4420 Encounter for fitting and adjustment of artificial eye, unspecified: Secondary | ICD-10-CM | POA: Diagnosis not present

## 2023-01-12 ENCOUNTER — Ambulatory Visit (INDEPENDENT_AMBULATORY_CARE_PROVIDER_SITE_OTHER): Payer: 59

## 2023-01-12 VITALS — Ht >= 80 in | Wt 325.0 lb

## 2023-01-12 DIAGNOSIS — Z0000 Encounter for general adult medical examination without abnormal findings: Secondary | ICD-10-CM
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
## 2023-01-12 NOTE — Patient Instructions (Addendum)
Caleb Hoover , Thank you for taking time to come for your Medicare Wellness Visit. I appreciate your ongoing commitment to your health goals. Please review the following plan we discussed and let me know if I can assist you in the future.   These are the goals we discussed:  Goals       Increase physical activity (pt-stated)      I want a family of my own.      Patient Stated (pt-stated)      None at this time.        This is a list of the screening recommended for you and due dates:  Health Maintenance  Topic Date Due   DTaP/Tdap/Td vaccine (2 - Td or Tdap) 02/10/2020   Flu Shot  03/01/2023   Medicare Annual Wellness Visit  01/12/2024   Hepatitis C Screening  Completed   HIV Screening  Completed   HPV Vaccine  Aged Out   COVID-19 Vaccine  Discontinued    Advanced directives: Advance directive discussed with you today. Even though you declined this today, please call our office should you change your mind, and we can give you the proper paperwork for you to fill out.   Conditions/risks identified: None  Next appointment: Follow up in one year for your annual wellness visit   Preventive Care 36-72 Years Old, Male Preventive care refers to lifestyle choices and visits with your health care provider that can promote health and wellness. Preventive care visits are also called wellness exams. What can I expect for my preventive care visit? Counseling During your preventive care visit, your health care provider may ask about your: Medical history, including: Past medical problems. Family medical history. Current health, including: Emotional well-being. Home life and relationship well-being. Sexual activity. Lifestyle, including: Alcohol, nicotine or tobacco, and drug use. Access to firearms. Diet, exercise, and sleep habits. Safety issues such as seatbelt and bike helmet use. Sunscreen use. Work and work Astronomer. Physical exam Your health care provider may check  your: Height and weight. These may be used to calculate your BMI (body mass index). BMI is a measurement that tells if you are at a healthy weight. Waist circumference. This measures the distance around your waistline. This measurement also tells if you are at a healthy weight and may help predict your risk of certain diseases, such as type 2 diabetes and high blood pressure. Heart rate and blood pressure. Body temperature. Skin for abnormal spots. What immunizations do I need? Vaccines are usually given at various ages, according to a schedule. Your health care provider will recommend vaccines for you based on your age, medical history, and lifestyle or other factors, such as travel or where you work. What tests do I need? Screening Your health care provider may recommend screening tests for certain conditions. This may include: Lipid and cholesterol levels. Diabetes screening. This is done by checking your blood sugar (glucose) after you have not eaten for a while (fasting). Hepatitis B test. Hepatitis C test. HIV (human immunodeficiency virus) test. STI (sexually transmitted infection) testing, if you are at risk. Talk with your health care provider about your test results, treatment options, and if necessary, the need for more tests. Follow these instructions at home: Eating and drinking  Eat a healthy diet that includes fresh fruits and vegetables, whole grains, lean protein, and low-fat dairy products. Drink enough fluid to keep your urine pale yellow. Take vitamin and mineral supplements as recommended by your health care provider. Do  not drink alcohol if your health care provider tells you not to drink. If you drink alcohol: Limit how much you have to 0-2 drinks a day. Know how much alcohol is in your drink. In the [place] one drink equals one 12 oz bottle of beer (355 mL), one 5 oz glass of wine (148 mL), or one 1 oz glass of hard liquor (44 mL). Lifestyle Brush your teeth every  morning and night with fluoride toothpaste. Floss one time each day. Exercise for at least 30 minutes 5 or more days each week. Do not use any products that contain nicotine or tobacco. These products include cigarettes, chewing tobacco, and vaping devices, such as e-cigarettes. If you need help quitting, ask your health care provider. Do not use drugs. If you are sexually active, practice safe sex. Use a condom or other form of protection to prevent STIs. Find healthy ways to manage stress, such as: Meditation, yoga, or listening to music. Journaling. Talking to a trusted person. Spending time with friends and family. Minimize exposure to UV radiation to reduce your risk of skin cancer. Safety Always wear your seat belt while driving or riding in a vehicle. Do not drive: If you have been drinking alcohol. Do not ride with someone who has been drinking. If you have been using any mind-altering substances or drugs. While texting. When you are tired or distracted. Wear a helmet and other protective equipment during sports activities. If you have firearms in your house, make sure you follow all gun safety procedures. Seek help if you have been physically or sexually abused. What's next? Go to your health care provider once a year for an annual wellness visit. Ask your health care provider how often you should have your eyes and teeth checked. Stay up to date on all vaccines. This information is not intended to replace advice given to you by your health care provider. Make sure you discuss any questions you have with your health care provider. Document Revised: 01/12/2021 Document Reviewed: 01/12/2021 Elsevier Patient Education  2022 [employer].

## 2023-02-05 IMAGING — CR DG CHEST 2V
2 series · 2 of 2 positions shown · non-contrast
Comparison: 02/05/2020

CLINICAL DATA: Sternal chest pain and shortness of breath since
[DATE] a.m. today.

EXAM:
CHEST - 2 VIEW

[w chest pa]
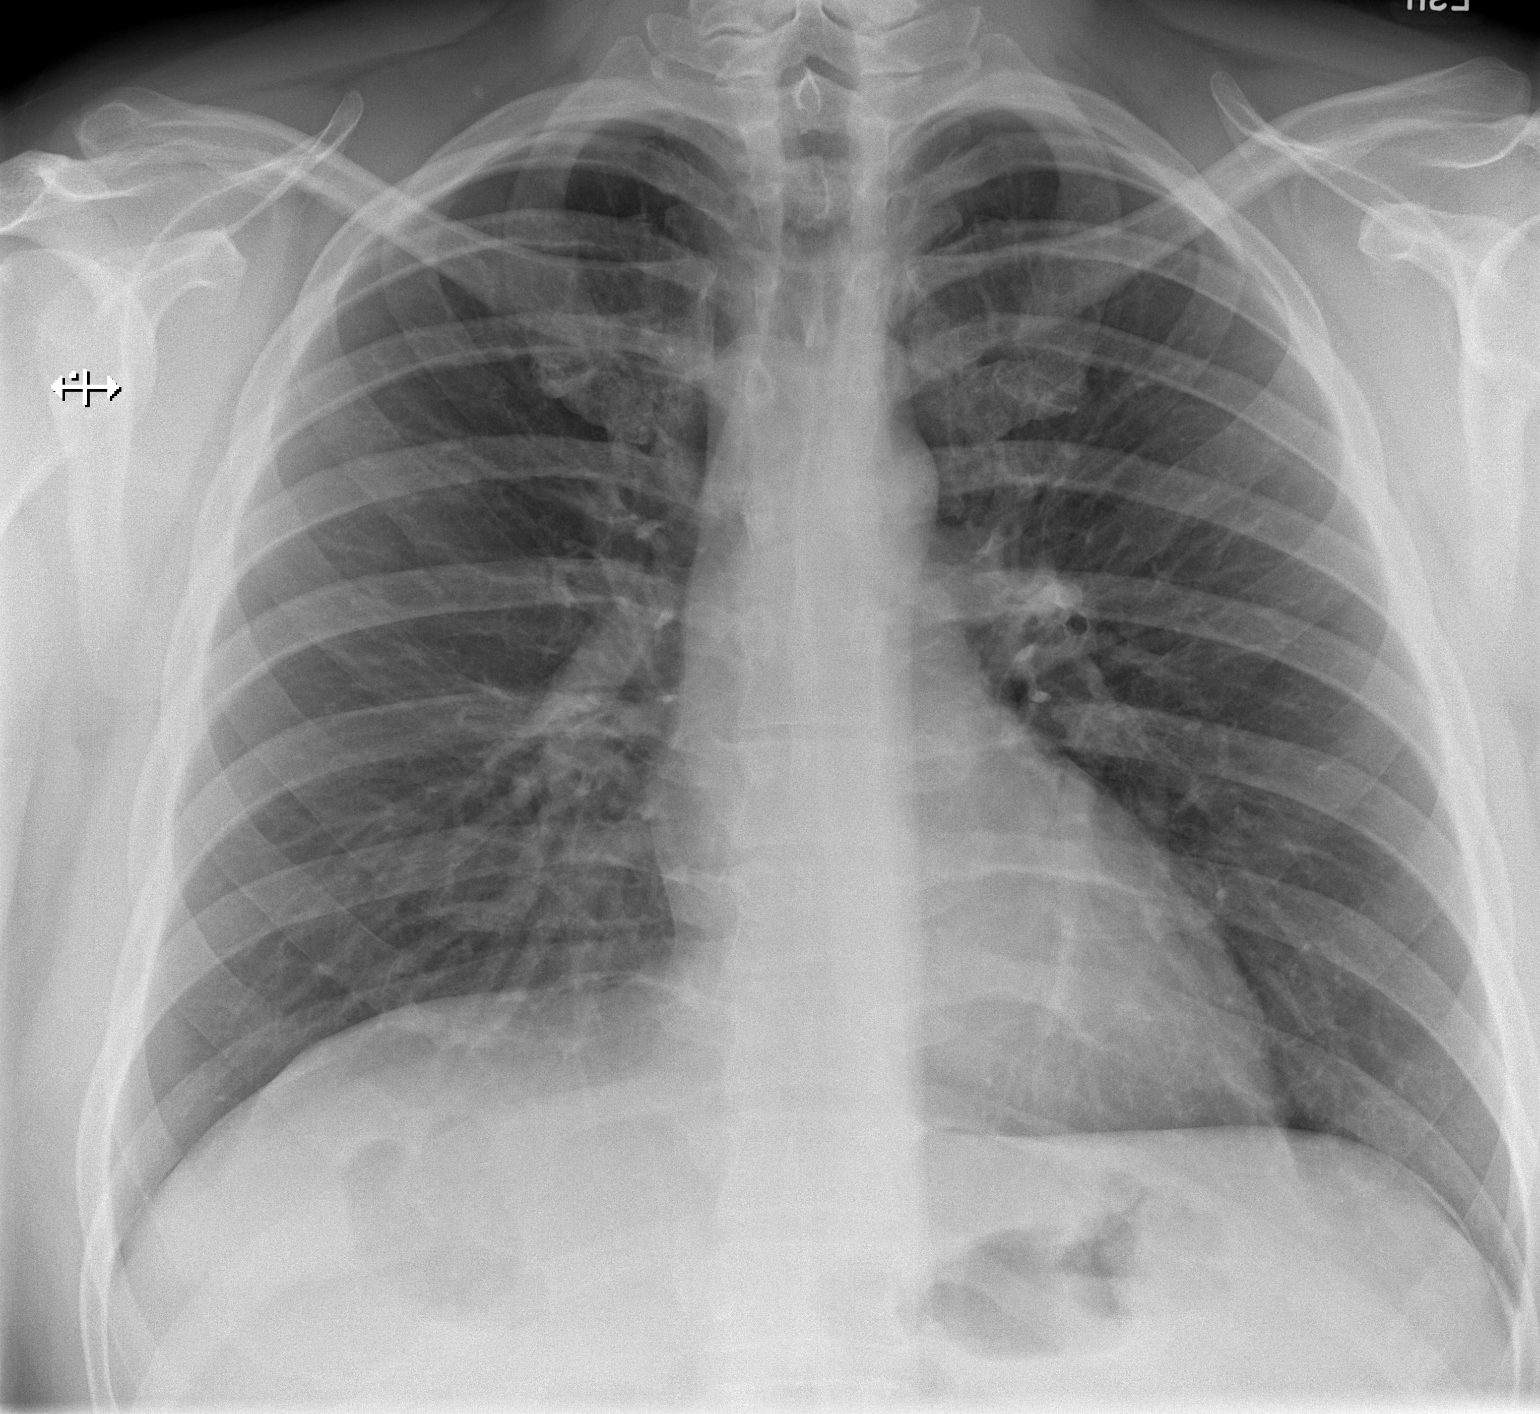

[w chest lat]
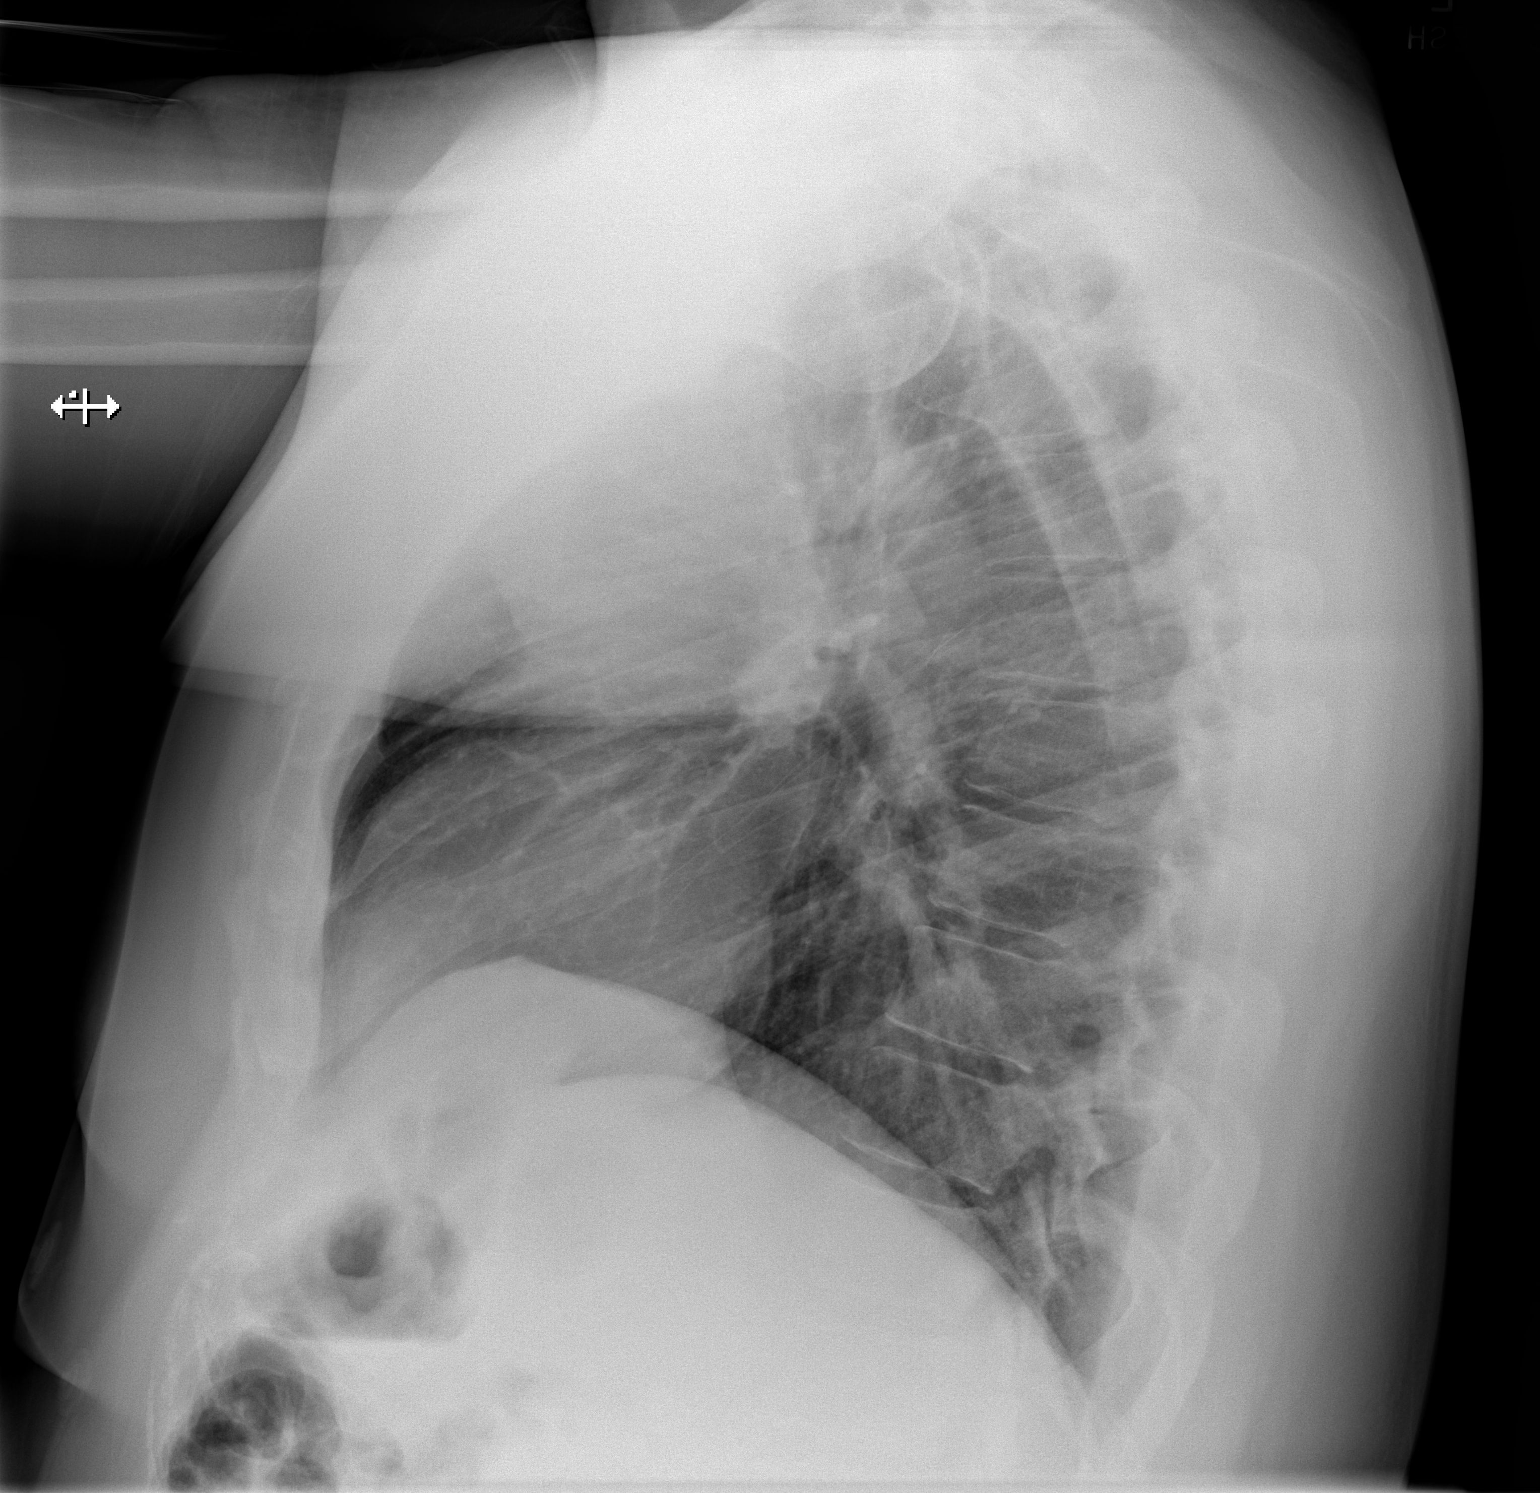

[2 of 2 positions shown; findings below may reference images not displayed]

FINDINGS: Stable mild peribronchial thickening. Normal sized heart. Clear
lungs with normal vascularity. Unremarkable bones.
IMPRESSION: Stable mild chronic bronchitic changes.  No acute abnormality.

## 2023-03-13 DIAGNOSIS — Z4422 Encounter for fitting and adjustment of artificial left eye: Secondary | ICD-10-CM | POA: Diagnosis not present

## 2023-08-20 ENCOUNTER — Telehealth: Payer: Self-pay | Admitting: Adult Health

## 2023-08-20 NOTE — Telephone Encounter (Signed)
Lmom for pt to call to sch cpe or office visit it has been over 2 yrs since pt has been sch by cory

## 2023-09-11 DIAGNOSIS — Z4422 Encounter for fitting and adjustment of artificial left eye: Secondary | ICD-10-CM | POA: Diagnosis not present

## 2024-01-16 ENCOUNTER — Telehealth: Payer: Self-pay

## 2024-01-16 NOTE — Telephone Encounter (Signed)
 Unsuccessful attempts to reach patient on preferred number listed in notes for scheduled AWV. Left message on voicemail okay to reschedule.

## 2024-01-22 ENCOUNTER — Encounter: Admitting: Adult Health

## 2024-01-22 NOTE — Progress Notes (Deleted)
 Subjective:    Patient ID: Caleb Hoover, male    DOB: 09-13-1985, 38 y.o.   MRN: [id number]  HPI Patient presents for yearly preventative medicine examination. He is a pleasant 38 year old male who  has a past medical history of Anxiety, Blindness of left eye, Chicken pox, Depression, Frequent headaches, Glaucoma, and Mental retardation.  He has not been seen since 11/2020.   Prediabetes - he was on Metformin  500 mg BID   Lab Results  Component Value Date   HGBA1C 6.5 12/24/2020   HGBA1C 5.8 03/06/2017   Hyperlipidemia - - He was on Lipitor 10 mg daily.  Lab Results  Component Value Date   CHOL 243 (H) 12/24/2020   HDL 39.20 12/24/2020   LDLCALC 176 (H) 12/24/2020   TRIG 138.0 12/24/2020   CHOLHDL 6 12/24/2020   Obesity -  Wt Readings from Last 3 Encounters:  01/12/23 (!) 325 lb (147.4 kg)  01/09/22 (!) 310 lb (140.6 kg)  08/22/21 (!) 310 lb (140.6 kg)     All immunizations and health maintenance protocols were reviewed with the patient and needed orders were placed.  Appropriate screening laboratory values were ordered for the patient including screening of hyperlipidemia, renal function and hepatic function. If indicated by BPH, a PSA was ordered.  Medication reconciliation,  past medical history, social history, problem list and allergies were reviewed in detail with the patient  Goals were established with regard to weight loss, exercise, and  diet in compliance with medications  Review of Systems  Constitutional: Negative.   HENT: Negative.    Eyes: Negative.   Respiratory: Negative.    Cardiovascular: Negative.   Gastrointestinal: Negative.   Endocrine: Negative.   Genitourinary: Negative.   Musculoskeletal: Negative.   Skin: Negative.   Allergic/Immunologic: Negative.   Neurological: Negative.   Hematological: Negative.   Psychiatric/Behavioral: Negative.    All other systems reviewed and are negative.      Objective:   Physical  Exam Vitals and nursing note reviewed.  Constitutional:      General: He is not in acute distress.    Appearance: Normal appearance. He is not ill-appearing.  HENT:     Head: Normocephalic and atraumatic.     Right Ear: Tympanic membrane, ear canal and external ear normal. There is no impacted cerumen.     Left Ear: Tympanic membrane, ear canal and external ear normal. There is no impacted cerumen.     Nose: Nose normal. No congestion or rhinorrhea.     Mouth/Throat:     Mouth: Mucous membranes are moist.     Pharynx: Oropharynx is clear.   Eyes:     Extraocular Movements: Extraocular movements intact.     Conjunctiva/sclera: Conjunctivae normal.     Pupils: Pupils are equal, round, and reactive to light.   Neck:     Vascular: No carotid bruit.   Cardiovascular:     Rate and Rhythm: Normal rate and regular rhythm.     Pulses: Normal pulses.     Heart sounds: No murmur heard.    No friction rub. No gallop.  Pulmonary:     Effort: Pulmonary effort is normal.     Breath sounds: Normal breath sounds.  Abdominal:     General: Abdomen is flat. Bowel sounds are normal. There is no distension.     Palpations: Abdomen is soft. There is no mass.     Tenderness: There is no abdominal tenderness. There is no guarding or rebound.  Hernia: No hernia is present.   Musculoskeletal:        General: Normal range of motion.     Cervical back: Normal range of motion and neck supple.  Lymphadenopathy:     Cervical: No cervical adenopathy.   Skin:    General: Skin is warm and dry.     Capillary Refill: Capillary refill takes less than 2 seconds.   Neurological:     General: No focal deficit present.     Mental Status: He is alert and oriented to person, place, and time.   Psychiatric:        Mood and Affect: Mood normal.        Behavior: Behavior normal.        Thought Content: Thought content normal.        Judgment: Judgment normal.           Assessment & Plan:

## 2024-02-07 ENCOUNTER — Ambulatory Visit: Admitting: Adult Health

## 2024-02-07 ENCOUNTER — Other Ambulatory Visit: Payer: Self-pay | Admitting: Adult Health

## 2024-02-07 ENCOUNTER — Encounter: Payer: Self-pay | Admitting: Adult Health

## 2024-02-07 VITALS — BP 138/80 | HR 54 | Temp 98.2°F | Ht >= 80 in | Wt 359.0 lb

## 2024-02-07 DIAGNOSIS — Z23 Encounter for immunization: Secondary | ICD-10-CM

## 2024-02-07 DIAGNOSIS — Z6838 Body mass index (BMI) 38.0-38.9, adult: Secondary | ICD-10-CM

## 2024-02-07 DIAGNOSIS — R7303 Prediabetes: Secondary | ICD-10-CM

## 2024-02-07 DIAGNOSIS — E66812 Obesity, class 2: Secondary | ICD-10-CM

## 2024-02-07 DIAGNOSIS — Z0000 Encounter for general adult medical examination without abnormal findings: Secondary | ICD-10-CM | POA: Diagnosis not present

## 2024-02-07 DIAGNOSIS — E782 Mixed hyperlipidemia: Secondary | ICD-10-CM

## 2024-02-07 LAB — CBC
HCT: 43.6 % (ref 39.0–52.0)
Hemoglobin: 14.6 g/dL (ref 13.0–17.0)
MCHC: 33.5 g/dL (ref 30.0–36.0)
MCV: 84.4 fl (ref 78.0–100.0)
Platelets: 298 K/uL (ref 150.0–400.0)
RBC: 5.17 Mil/uL (ref 4.22–5.81)
RDW: 14.2 % (ref 11.5–15.5)
WBC: 6 K/uL (ref 4.0–10.5)

## 2024-02-07 LAB — COMPREHENSIVE METABOLIC PANEL WITH GFR
ALT: 28 U/L (ref 0–53)
AST: 22 U/L (ref 0–37)
Albumin: 4.2 g/dL (ref 3.5–5.2)
Alkaline Phosphatase: 107 U/L (ref 39–117)
BUN: 14 mg/dL (ref 6–23)
CO2: 24 meq/L (ref 19–32)
Calcium: 9.1 mg/dL (ref 8.4–10.5)
Chloride: 104 meq/L (ref 96–112)
Creatinine, Ser: 0.94 mg/dL (ref 0.40–1.50)
GFR: 103.88 mL/min (ref >60.00)
Glucose, Bld: 102 mg/dL — ABNORMAL HIGH (ref 70–99)
Potassium: 3.7 meq/L (ref 3.5–5.1)
Sodium: 138 meq/L (ref 135–145)
Total Bilirubin: 0.4 mg/dL (ref 0.2–1.2)
Total Protein: 7.5 g/dL (ref 6.0–8.3)

## 2024-02-07 LAB — LIPID PANEL
Cholesterol: 229 mg/dL — ABNORMAL HIGH (ref 0–200)
HDL: 43.5 mg/dL (ref >39.00)
LDL Cholesterol: 161 mg/dL — ABNORMAL HIGH (ref 0–99)
NonHDL: 185.41
Total CHOL/HDL Ratio: 5
Triglycerides: 121 mg/dL (ref 0.0–149.0)
VLDL: 24.2 mg/dL (ref 0.0–40.0)

## 2024-02-07 LAB — HEMOGLOBIN A1C: Hgb A1c MFr Bld: 6.7 % — ABNORMAL HIGH (ref 4.6–6.5)

## 2024-02-07 LAB — TSH: TSH: 1.84 u[IU]/mL (ref 0.35–5.50)

## 2024-02-07 NOTE — Patient Instructions (Signed)
 It was great seeing you today   We will follow up with you regarding your lab work   Please let me know if you need anything

## 2024-02-07 NOTE — Progress Notes (Signed)
 Subjective:    Patient ID: Caleb Hoover, male    DOB: 1986-05-17, 38 y.o.   MRN: [id number]  HPI  Patient presents for yearly preventative medicine examination. He is a pleasant 38 year old male who  has a past medical history of Anxiety, Blindness of left eye, Chicken pox, Depression, Frequent headaches, Glaucoma, and Mental retardation.  He has not been seen since 11/2020.   Prediabetes - he was on Metformin  500 mg BID  Lab Results  Component Value Date   HGBA1C 6.5 12/24/2020   HGBA1C 5.8 03/06/2017   Hyperlipidemia - - He was on Lipitor 10 mg daily.  Lab Results  Component Value Date   CHOL 243 (H) 12/24/2020   HDL 39.20 12/24/2020   LDLCALC 176 (H) 12/24/2020   TRIG 138.0 12/24/2020   CHOLHDL 6 12/24/2020   Obesity - He does report that he has been working out at the gym 4 times a week and tries to eat healthy.  Wt Readings from Last 3 Encounters:  02/07/24 (!) 359 lb (162.8 kg)  01/12/23 (!) 325 lb (147.4 kg)  01/09/22 (!) 310 lb (140.6 kg)    All immunizations and health maintenance protocols were reviewed with the patient and needed orders were placed.  Appropriate screening laboratory values were ordered for the patient including screening of hyperlipidemia, renal function and hepatic function.  Medication reconciliation,  past medical history, social history, problem list and allergies were reviewed in detail with the patient  Goals were established with regard to weight loss, exercise, and  diet in compliance with medications. He has been working out 4 days a week at [employer] and has been trying to eat healthy.   Review of Systems  Constitutional: Negative.   HENT: Negative.    Eyes: Negative.   Respiratory: Negative.    Cardiovascular: Negative.   Gastrointestinal: Negative.   Endocrine: Negative.   Genitourinary: Negative.   Musculoskeletal: Negative.   Skin: Negative.   Allergic/Immunologic: Negative.   Neurological: Negative.    Hematological: Negative.   Psychiatric/Behavioral: Negative.    All other systems reviewed and are negative.  Past Medical History:  Diagnosis Date   Anxiety    Blindness of left eye    hit with paintball at age 65   Chicken pox    Depression    Frequent headaches    Glaucoma    Learning difficulty involving reading    Has trouble reading     Social History   Socioeconomic History   Marital status: Single    Spouse name: Not on file   Number of children: Not on file   Years of education: Not on file   Highest education level: Not on file  Occupational History   Not on file  Tobacco Use   Smoking status: Never   Smokeless tobacco: Never  Vaping Use   Vaping status: Never Used  Substance and Sexual Activity   Alcohol use: Yes   Drug use: No   Sexual activity: Yes  Other Topics Concern   Not on file  Social History Narrative   Not on file   Social Drivers of Health   Financial Resource Strain: Low Risk  (01/12/2023)   Overall Financial Resource Strain (CARDIA)    Difficulty of Paying Living Expenses: Not hard at all  Food Insecurity: No Food Insecurity (01/12/2023)   Hunger Vital Sign    Worried About Running Out of Food in the Last Year: Never true    Ran  Out of Food in the Last Year: Never true  Transportation Needs: No Transportation Needs (01/12/2023)   PRAPARE - Administrator, Civil Service (Medical): No    Lack of Transportation (Non-Medical): No  Physical Activity: Insufficiently Active (01/12/2023)   Exercise Vital Sign    Days of Exercise per Week: 4 days    Minutes of Exercise per Session: 30 min  Stress: No Stress Concern Present (01/12/2023)   [employer] of Occupational Health - Occupational Stress Questionnaire    Feeling of Stress : Not at all  Social Connections: Socially Isolated (01/12/2023)   Social Connection and Isolation Panel    Frequency of Communication with Friends and Family: More than three times a week     Frequency of Social Gatherings with Friends and Family: More than three times a week    Attends Religious Services: Never    Database administrator or Organizations: No    Attends Banker Meetings: Never    Marital Status: Never married  Intimate Partner Violence: Not At Risk (01/12/2023)   Humiliation, Afraid, Rape, and Kick questionnaire    Fear of Current or Ex-Partner: No    Emotionally Abused: No    Physically Abused: No    Sexually Abused: No    Past Surgical History:  Procedure Laterality Date   left eye surgery       Family History  Family history unknown: Yes    Allergies  Allergen Reactions   Ceclor [Cefaclor] Anaphylaxis, Hives and Other (See Comments)    Childhood allergy.    Oxycodone  Shortness Of Breath and Nausea And Vomiting    No current outpatient medications on file prior to visit.   No current facility-administered medications on file prior to visit.    BP 138/80   Pulse (!) 54   Temp 98.2 F (36.8 C) (Oral)   Ht 6' 9.25 (2.064 m)   Wt (!) 359 lb (162.8 kg)   SpO2 97%   BMI 38.23 kg/m       Objective:   Physical Exam Vitals and nursing note reviewed.  Constitutional:      General: He is not in acute distress.    Appearance: Normal appearance. He is obese. He is not ill-appearing.  HENT:     Head: Normocephalic and atraumatic.     Right Ear: Tympanic membrane, ear canal and external ear normal. There is no impacted cerumen.     Left Ear: Tympanic membrane, ear canal and external ear normal. There is no impacted cerumen.     Nose: Nose normal. No congestion or rhinorrhea.     Mouth/Throat:     Mouth: Mucous membranes are moist.     Pharynx: Oropharynx is clear.  Eyes:     Extraocular Movements: Extraocular movements intact.     Conjunctiva/sclera: Conjunctivae normal.     Pupils: Pupils are equal, round, and reactive to light.  Neck:     Vascular: No carotid bruit.  Cardiovascular:     Rate and Rhythm: Normal rate and  regular rhythm.     Pulses: Normal pulses.     Heart sounds: No murmur heard.    No friction rub. No gallop.  Pulmonary:     Effort: Pulmonary effort is normal.     Breath sounds: Normal breath sounds.  Abdominal:     General: Abdomen is flat. Bowel sounds are normal. There is no distension.     Palpations: Abdomen is soft. There is no mass.  Tenderness: There is no abdominal tenderness. There is no guarding or rebound.     Hernia: No hernia is present.  Musculoskeletal:        General: Normal range of motion.     Cervical back: Normal range of motion and neck supple.  Lymphadenopathy:     Cervical: No cervical adenopathy.  Skin:    General: Skin is warm and dry.     Capillary Refill: Capillary refill takes less than 2 seconds.  Neurological:     General: No focal deficit present.     Mental Status: He is alert and oriented to person, place, and time.  Psychiatric:        Mood and Affect: Mood normal.        Behavior: Behavior normal.        Thought Content: Thought content normal.        Judgment: Judgment normal.       Assessment & Plan:  1. Routine general medical examination at a health care facility (Primary) Today patient counseled on age appropriate routine health concerns for screening and prevention, each reviewed and up to date or declined. Immunizations reviewed and up to date or declined. Labs ordered and reviewed. Risk factors for depression reviewed and negative. Hearing function and visual acuity are intact. ADLs screened and addressed as needed. Functional ability and level of safety reviewed and appropriate. Education, counseling and referrals performed based on assessed risks today. Patient provided with a copy of personalized plan for preventive services.   2. Prediabetes - Consider going back on Metformin   - Hemoglobin A1c; Future  3. Mixed hyperlipidemia - Will likely need to be placed on statin therapy  - Lipid panel; Future - TSH; Future - CBC;  Future - Comprehensive metabolic panel with GFR; Future  4. Need for tetanus booster  - Tdap vaccine greater than or equal to 7yo IM  5. Class 2 obesity - Continue to go to the gym and eat healthy  - Consider GLP 1 therapy  - Lipid panel; Future - TSH; Future - CBC; Future - Comprehensive metabolic panel with GFR; Future - Hemoglobin A1c; Future  6. BMI 38.0-38.9,adult  - Lipid panel; Future - TSH; Future - CBC; Future - Comprehensive metabolic panel with GFR; Future - Hemoglobin A1c; Future  Darleene Shape, NP

## 2024-02-08 ENCOUNTER — Other Ambulatory Visit: Payer: Self-pay | Admitting: Adult Health

## 2024-02-08 ENCOUNTER — Ambulatory Visit: Payer: Self-pay | Admitting: Adult Health

## 2024-02-08 MED ORDER — ROSUVASTATIN CALCIUM 10 MG PO TABS: 10.0000 mg | ORAL_TABLET | Freq: Every day | ORAL | 3 refills | Status: AC

## 2024-02-08 MED ORDER — METFORMIN HCL ER 500 MG PO TB24: 500.0000 mg | ORAL_TABLET | Freq: Two times a day (BID) | ORAL | 0 refills | Status: AC

## 2024-02-27 ENCOUNTER — Ambulatory Visit (INDEPENDENT_AMBULATORY_CARE_PROVIDER_SITE_OTHER)

## 2024-02-27 VITALS — Ht >= 80 in | Wt 359.0 lb

## 2024-02-27 DIAGNOSIS — Z0000 Encounter for general adult medical examination without abnormal findings: Secondary | ICD-10-CM

## 2024-02-27 NOTE — Progress Notes (Signed)
 Subjective:   Caleb Hoover is a 38 y.o. who presents for a Medicare Wellness preventive visit.  As a reminder, Annual Wellness Visits don't include a physical exam, and some assessments may be limited, especially if this visit is performed virtually. We may recommend an in-person follow-up visit with your provider if needed.  Visit Complete: Virtual I connected with  Charlie Ozell Ingles on 02/27/24 by a audio enabled telemedicine application and verified that I am speaking with the correct person using two identifiers.  Patient Location: Home  Provider Location: Home Office  I discussed the limitations of evaluation and management by telemedicine. The patient expressed understanding and agreed to proceed.  Vital Signs: Because this visit was a virtual/telehealth visit, some criteria may be missing or patient reported. Any vitals not documented were not able to be obtained and vitals that have been documented are patient reported.    Persons Participating in Visit: Patient.  AWV Questionnaire: No: Patient Medicare AWV questionnaire was not completed prior to this visit.  Cardiac Risk Factors include: advanced age (>47men, >42 women);male gender     Objective:    Today's Vitals   02/27/24 0842  Weight: (!) 359 lb (162.8 kg)  Height: 7' (2.134 m)   Body mass index is 35.77 kg/m.     02/27/2024    8:48 AM 01/12/2023    1:17 PM 01/09/2022    2:40 PM 12/14/2020    1:48 PM 02/06/2020   12:14 AM 02/05/2020    1:14 PM 04/30/2016    1:00 AM  Advanced Directives  Does Patient Have a Medical Advance Directive? Yes No No No No No No   Type of Estate agent of [place];Living will        Copy of Healthcare Power of Attorney in Chart? No - copy requested        Would patient like information on creating a medical advance directive?  No - Patient declined No - Patient declined No - Patient declined No - Patient declined  No - patient declined information       Data saved with a previous flowsheet row definition    Current Medications (verified) Outpatient Encounter Medications as of 02/27/2024  Medication Sig   metFORMIN  (GLUCOPHAGE -XR) 500 MG 24 hr tablet Take 1 tablet (500 mg total) by mouth 2 (two) times daily with a meal.   rosuvastatin  (CRESTOR ) 10 MG tablet Take 1 tablet (10 mg total) by mouth daily.   No facility-administered encounter medications on file as of 02/27/2024.    Allergies (verified) Ceclor [cefaclor] and Oxycodone    History: Past Medical History:  Diagnosis Date   Anxiety    Blindness of left eye    hit with paintball at age 45   Chicken pox    Depression    Frequent headaches    Glaucoma    Learning difficulty involving reading    Has trouble reading    Past Surgical History:  Procedure Laterality Date   left eye surgery      Family History  Family history unknown: Yes   Social History   Socioeconomic History   Marital status: Single    Spouse name: Not on file   Number of children: Not on file   Years of education: Not on file   Highest education level: Not on file  Occupational History   Not on file  Tobacco Use   Smoking status: Never   Smokeless tobacco: Never  Vaping Use   Vaping  status: Never Used  Substance and Sexual Activity   Alcohol use: Yes   Drug use: No   Sexual activity: Yes  Other Topics Concern   Not on file  Social History Narrative   Not on file   Social Drivers of Health   Financial Resource Strain: Low Risk  (02/27/2024)   Overall Financial Resource Strain (CARDIA)    Difficulty of Paying Living Expenses: Not hard at all  Food Insecurity: No Food Insecurity (02/27/2024)   Hunger Vital Sign    Worried About Running Out of Food in the Last Year: Never true    Ran Out of Food in the Last Year: Never true  Transportation Needs: No Transportation Needs (02/27/2024)   PRAPARE - Administrator, Civil Service (Medical): No    Lack of Transportation  (Non-Medical): No  Physical Activity: Insufficiently Active (02/27/2024)   Exercise Vital Sign    Days of Exercise per Week: 4 days    Minutes of Exercise per Session: 30 min  Stress: No Stress Concern Present (02/27/2024)   [employer] of Occupational Health - Occupational Stress Questionnaire    Feeling of Stress: Not at all  Social Connections: Socially Isolated (02/27/2024)   Social Connection and Isolation Panel    Frequency of Communication with Friends and Family: More than three times a week    Frequency of Social Gatherings with Friends and Family: More than three times a week    Attends Religious Services: Never    Database administrator or Organizations: No    Attends Engineer, structural: Never    Marital Status: Never married    Tobacco Counseling Counseling given: Not Answered    Clinical Intake:  Pre-visit preparation completed: Yes  Pain : No/denies pain     BMI - recorded: 35.77 Nutritional Status: BMI > 30  Obese Nutritional Risks: None Diabetes: No  Lab Results  Component Value Date   HGBA1C 6.7 (H) 02/07/2024   HGBA1C 6.5 12/24/2020   HGBA1C 5.8 03/06/2017     How often do you need to have someone help you when you read instructions, pamphlets, or other written materials from your doctor or pharmacy?: 3 - Sometimes (Mother assist)  Interpreter Needed?: No  Information entered by :: Rojelio Blush LPN   Activities of Daily Living     02/27/2024    8:47 AM  In your present state of health, do you have any difficulty performing the following activities:  Hearing? 0  Vision? 0  Difficulty concentrating or making decisions? 0  Walking or climbing stairs? 0  Dressing or bathing? 0  Doing errands, shopping? 0  Preparing Food and eating ? N  Using the Toilet? N  In the past six months, have you accidently leaked urine? N  Do you have problems with loss of bowel control? N  Managing your Medications? N  Managing your Finances? N   Housekeeping or managing your Housekeeping? N    Patient Care Team: Merna Huxley, NP as PCP - General (Family Medicine)  I have updated your Care Teams any recent Medical Services you may have received from other providers in the past year.     Assessment:   This is a routine wellness examination for [name].  Hearing/Vision screen Hearing Screening - Comments:: Denies hearing difficulties   Vision Screening - Comments:: Wears rx glasses - up to date with routine eye exams with  [facility]   Goals Addressed  This Visit's Progress     Continue physical activity (pt-stated)         Depression Screen     02/27/2024    8:47 AM 02/07/2024    9:25 AM 01/12/2023    1:10 PM 07/07/2022   11:11 AM 01/09/2022    2:36 PM 12/14/2020    1:46 PM 09/06/2020    2:05 PM  PHQ 2/9 Scores  PHQ - 2 Score 0 0 0 0 0 0 0  PHQ- 9 Score 0 0         Fall Risk     02/27/2024    8:48 AM 01/12/2023    1:16 PM 01/11/2023   11:12 PM 01/09/2022    2:39 PM 12/14/2020    1:50 PM  Fall Risk   Falls in the past year? 0 0 0 0 0  Number falls in past yr: 0 0 0 0 0  Injury with Fall? 0 0 0 0 0  Risk for fall due to : No Fall Risks No Fall Risks  No Fall Risks Impaired vision  Follow up Falls evaluation completed Falls prevention discussed   Falls prevention discussed      Data saved with a previous flowsheet row definition    MEDICARE RISK AT HOME:  Medicare Risk at Home Any stairs in or around the home?: Yes If so, are there any without handrails?: No Home free of loose throw rugs in walkways, pet beds, electrical cords, etc?: Yes Adequate lighting in your home to reduce risk of falls?: Yes Life alert?: No Use of a cane, walker or w/c?: No Grab bars in the bathroom?: No Shower chair or bench in shower?: No Elevated toilet seat or a handicapped toilet?: No  TIMED UP AND GO:  Was the test performed?  No  Cognitive Function: 6CIT completed        02/27/2024    8:48 AM  01/12/2023    1:17 PM 01/09/2022    2:40 PM  6CIT Screen  What Year? 0 points 0 points 0 points  What month? 0 points 0 points 0 points  What time? 0 points 0 points 0 points  Count back from 20 0 points 0 points 0 points  Months in reverse 4 points 4 points 2 points  Repeat phrase 6 points 2 points 0 points  Total Score 10 points 6 points 2 points    Immunizations Immunization History  Administered Date(s) Administered   PFIZER Comirnaty(Gray Top)Covid-19 Tri-Sucrose Vaccine 11/12/2019, 12/10/2019, 06/05/2020   Tdap 02/09/2010, 02/07/2024    Screening Tests Health Maintenance  Topic Date Due   Hepatitis B Vaccines (1 of 3 - 19+ 3-dose series) Never done   HPV VACCINES (1 - 3-dose SCDM series) Never done   INFLUENZA VACCINE  02/29/2024   Medicare Annual Wellness (AWV)  02/26/2025   DTaP/Tdap/Td (3 - Td or Tdap) 02/06/2034   Hepatitis C Screening  Completed   HIV Screening  Completed   Meningococcal B Vaccine  Aged Out   COVID-19 Vaccine  Discontinued    Health Maintenance  Health Maintenance Due  Topic Date Due   Hepatitis B Vaccines (1 of 3 - 19+ 3-dose series) Never done   HPV VACCINES (1 - 3-dose SCDM series) Never done   Health Maintenance Items Addressed:   Additional Screening:  Vision Screening: Recommended annual ophthalmology exams for early detection of glaucoma and other disorders of the eye. Would you like a referral to an eye doctor? No    Dental  Screening: Recommended annual dental exams for proper oral hygiene  Community Resource Referral / Chronic Care Management: CRR required this visit?  No   CCM required this visit?  No   Plan:    I have personally reviewed and noted the following in the patient's chart:   Medical and social history Use of alcohol, tobacco or illicit drugs  Current medications and supplements including opioid prescriptions. Patient is not currently taking opioid prescriptions. Functional ability and status Nutritional  status Physical activity Advanced directives List of other physicians Hospitalizations, surgeries, and ER visits in previous 12 months Vitals Screenings to include cognitive, depression, and falls Referrals and appointments  In addition, I have reviewed and discussed with patient certain preventive protocols, quality metrics, and best practice recommendations. A written personalized care plan for preventive services as well as general preventive health recommendations were provided to patient.   Rojelio LELON Blush, LPN   2/69/7974   After Visit Summary: (MyChart) Due to this being a telephonic visit, the after visit summary with patients personalized plan was offered to patient via MyChart   Notes: Nothing significant to report at this time.

## 2024-02-27 NOTE — Patient Instructions (Addendum)
 Caleb Hoover , Thank you for taking time out of your busy schedule to complete your Annual Wellness Visit with me. I enjoyed our conversation and look forward to speaking with you again next year. I, as well as your care team,  appreciate your ongoing commitment to your health goals. Please review the following plan we discussed and let me know if I can assist you in the future. Your Game plan/ To Do List    Referrals: If you haven't heard from the office you've been referred to, please reach out to them at the phone provided.   Follow up Visits: Next Medicare AWV with our clinical staff: 03/04/25 @ 8:40a   Have you seen your provider in the last 6 months (3 months if uncontrolled diabetes)? 01/22/24 Next Office Visit with your provider:   Clinician Recommendations:  Aim for 30 minutes of exercise or brisk walking, 6-8 glasses of water, and 5 servings of fruits and vegetables each day.       This is a list of the screening recommended for you and due dates:  Health Maintenance  Topic Date Due   Hepatitis B Vaccine (1 of 3 - 19+ 3-dose series) Never done   HPV Vaccine (1 - 3-dose SCDM series) Never done   Flu Shot  02/29/2024   Medicare Annual Wellness Visit  02/26/2025   DTaP/Tdap/Td vaccine (3 - Td or Tdap) 02/06/2034   Hepatitis C Screening  Completed   HIV Screening  Completed   Meningitis B Vaccine  Aged Out   COVID-19 Vaccine  Discontinued    Advanced directives: (Copy Requested) Please bring a copy of your health care power of attorney and living will to the office to be added to your chart at your convenience. You can mail to [facility] [address] [place], [place] [address] or email to [email or web address] Advance Care Planning is important because it:  [x]  Makes sure you receive the medical care that is consistent with your values, goals, and preferences  [x]  It provides guidance to your family and loved ones and reduces their decisional burden about  whether or not they are making the right decisions based on your wishes.  Follow the link provided in your after visit summary or read over the paperwork we have mailed to you to help you started getting your Advance Directives in place. If you need assistance in completing these, please reach out to us  so that we can help you!  See attachments for Preventive Care and Fall Prevention Tips.

## 2024-03-18 DIAGNOSIS — Z4422 Encounter for fitting and adjustment of artificial left eye: Secondary | ICD-10-CM | POA: Diagnosis not present

## 2025-03-04 ENCOUNTER — Ambulatory Visit
